# Patient Record
Sex: Female | Born: 1965
Health system: Southern US, Community
[De-identification: ages and names within clinical notes are randomized; demographics above are authoritative.]

## PROBLEM LIST (undated history)

## (undated) DIAGNOSIS — Z90710 Acquired absence of both cervix and uterus: Secondary | ICD-10-CM

## (undated) DIAGNOSIS — E785 Hyperlipidemia, unspecified: Secondary | ICD-10-CM

## (undated) DIAGNOSIS — Z8541 Personal history of malignant neoplasm of cervix uteri: Secondary | ICD-10-CM

## (undated) DIAGNOSIS — N6019 Diffuse cystic mastopathy of unspecified breast: Secondary | ICD-10-CM

## (undated) DIAGNOSIS — F329 Major depressive disorder, single episode, unspecified: Secondary | ICD-10-CM

## (undated) DIAGNOSIS — F259 Schizoaffective disorder, unspecified: Secondary | ICD-10-CM

## (undated) DIAGNOSIS — J302 Other seasonal allergic rhinitis: Secondary | ICD-10-CM

## (undated) DIAGNOSIS — F319 Bipolar disorder, unspecified: Secondary | ICD-10-CM

## (undated) DIAGNOSIS — N289 Disorder of kidney and ureter, unspecified: Secondary | ICD-10-CM

## (undated) DIAGNOSIS — Z08 Encounter for follow-up examination after completed treatment for malignant neoplasm: Secondary | ICD-10-CM

## (undated) DIAGNOSIS — M1990 Unspecified osteoarthritis, unspecified site: Secondary | ICD-10-CM

## (undated) DIAGNOSIS — Z720 Tobacco use: Secondary | ICD-10-CM

## (undated) DIAGNOSIS — F419 Anxiety disorder, unspecified: Secondary | ICD-10-CM

## (undated) DIAGNOSIS — R87629 Unspecified abnormal cytological findings in specimens from vagina: Secondary | ICD-10-CM

## (undated) DIAGNOSIS — Z87442 Personal history of urinary calculi: Secondary | ICD-10-CM

## (undated) DIAGNOSIS — F32A Depression, unspecified: Secondary | ICD-10-CM

## (undated) DIAGNOSIS — D649 Anemia, unspecified: Secondary | ICD-10-CM

## (undated) HISTORY — DX: Acquired absence of both cervix and uterus: Z90.710

## (undated) HISTORY — DX: Unspecified abnormal cytological findings in specimens from vagina: R87.629

## (undated) HISTORY — DX: Bipolar disorder, unspecified: F31.9

## (undated) HISTORY — DX: Major depressive disorder, single episode, unspecified: F32.9

## (undated) HISTORY — DX: Diffuse cystic mastopathy of unspecified breast: N60.19

## (undated) HISTORY — DX: Schizoaffective disorder, unspecified: F25.9

## (undated) HISTORY — PX: SUBLINGUAL CYST EXCISION: SHX5314

## (undated) HISTORY — DX: Hyperlipidemia, unspecified: E78.5

## (undated) HISTORY — DX: Encounter for follow-up examination after completed treatment for malignant neoplasm: Z08

## (undated) HISTORY — DX: Tobacco use: Z72.0

## (undated) HISTORY — DX: Personal history of malignant neoplasm of cervix uteri: Z85.41

## (undated) HISTORY — DX: Unspecified osteoarthritis, unspecified site: M19.90

## (undated) HISTORY — DX: Depression, unspecified: F32.A

## (undated) HISTORY — DX: Anxiety disorder, unspecified: F41.9

---

## 1986-11-14 HISTORY — PX: ABDOMINAL HYSTERECTOMY: SHX81

## 2011-01-17 ENCOUNTER — Other Ambulatory Visit: Payer: Self-pay | Admitting: *Deleted

## 2011-01-17 ENCOUNTER — Other Ambulatory Visit: Payer: Self-pay | Admitting: Internal Medicine

## 2011-01-17 DIAGNOSIS — N644 Mastodynia: Secondary | ICD-10-CM

## 2011-01-25 ENCOUNTER — Ambulatory Visit
Admission: RE | Admit: 2011-01-25 | Discharge: 2011-01-25 | Disposition: A | Discharge status: Home / Self Care | Payer: No Typology Code available for payment source | Source: Ambulatory Visit | Attending: *Deleted | Admitting: *Deleted

## 2011-01-25 DIAGNOSIS — N644 Mastodynia: Secondary | ICD-10-CM

## 2011-01-25 MED ORDER — GADOBENATE DIMEGLUMINE 529 MG/ML IV SOLN
17.0000 mL | Freq: Once | INTRAVENOUS | Status: AC | PRN
  Administered 2011-01-25: 17 mL via INTRAVENOUS

## 2013-11-14 HISTORY — PX: APPENDECTOMY: SHX54

## 2013-12-14 ENCOUNTER — Encounter (HOSPITAL_COMMUNITY): Payer: Self-pay

## 2013-12-14 ENCOUNTER — Ambulatory Visit (HOSPITAL_COMMUNITY)
Admission: RE | Admit: 2013-12-14 | Discharge: 2013-12-14 | Disposition: A | Discharge status: Home / Self Care | Payer: Medicare Other | Source: Ambulatory Visit | Attending: General Surgery | Admitting: General Surgery

## 2013-12-14 ENCOUNTER — Encounter: Payer: Self-pay | Admitting: Interventional Radiology

## 2013-12-14 ENCOUNTER — Other Ambulatory Visit (HOSPITAL_COMMUNITY): Payer: Self-pay | Admitting: General Surgery

## 2013-12-14 ENCOUNTER — Ambulatory Visit (HOSPITAL_COMMUNITY)
Admission: EM | Admit: 2013-12-14 | Discharge: 2013-12-14 | Disposition: A | Discharge status: Home / Self Care | Payer: Medicare Other | Source: Other Acute Inpatient Hospital | Attending: Emergency Medicine | Admitting: Emergency Medicine

## 2013-12-14 DIAGNOSIS — Y836 Removal of other organ (partial) (total) as the cause of abnormal reaction of the patient, or of later complication, without mention of misadventure at the time of the procedure: Secondary | ICD-10-CM | POA: Insufficient documentation

## 2013-12-14 DIAGNOSIS — L0291 Cutaneous abscess, unspecified: Secondary | ICD-10-CM

## 2013-12-14 DIAGNOSIS — T8149XA Infection following a procedure, other surgical site, initial encounter: Principal | ICD-10-CM

## 2013-12-14 DIAGNOSIS — T8140XA Infection following a procedure, unspecified, initial encounter: Secondary | ICD-10-CM | POA: Insufficient documentation

## 2013-12-14 DIAGNOSIS — T8143XA Infection following a procedure, organ and space surgical site, initial encounter: Secondary | ICD-10-CM

## 2013-12-14 DIAGNOSIS — L03319 Cellulitis of trunk, unspecified: Secondary | ICD-10-CM

## 2013-12-14 DIAGNOSIS — L02219 Cutaneous abscess of trunk, unspecified: Secondary | ICD-10-CM | POA: Insufficient documentation

## 2013-12-14 MED ORDER — MIDAZOLAM HCL 2 MG/2ML IJ SOLN
INTRAMUSCULAR | Status: AC | PRN
  Administered 2013-12-14: 2 mg via INTRAVENOUS
  Administered 2013-12-14: 1 mg via INTRAVENOUS

## 2013-12-14 MED ORDER — LIDOCAINE HCL 1 % IJ SOLN
INTRAMUSCULAR | Status: AC
  Filled 2013-12-14: qty 10

## 2013-12-14 MED ORDER — FENTANYL CITRATE 0.05 MG/ML IJ SOLN
INTRAMUSCULAR | Status: AC
  Filled 2013-12-14: qty 4

## 2013-12-14 MED ORDER — FENTANYL CITRATE 0.05 MG/ML IJ SOLN
INTRAMUSCULAR | Status: AC | PRN
  Administered 2013-12-14 (×2): 50 ug via INTRAVENOUS

## 2013-12-14 MED ORDER — MIDAZOLAM HCL 2 MG/2ML IJ SOLN
INTRAMUSCULAR | Status: AC
  Filled 2013-12-14: qty 4

## 2013-12-14 NOTE — Progress Notes (Unsigned)
Patient ID: Regina Carson, female   DOB: Oct 21, 1966, 48 y.o.   MRN: [id number]  CT guided RLQ abscess drain placed 77ml beige purulent aspirate sent for GS, C&S No complication No blood loss. See complete dictation in [facility].

## 2013-12-17 LAB — CULTURE, ROUTINE-ABSCESS: Special Requests: NORMAL

## 2015-12-14 ENCOUNTER — Other Ambulatory Visit: Payer: Self-pay | Admitting: Family Medicine

## 2015-12-14 DIAGNOSIS — F1210 Cannabis abuse, uncomplicated: Secondary | ICD-10-CM | POA: Diagnosis not present

## 2015-12-14 DIAGNOSIS — F329 Major depressive disorder, single episode, unspecified: Secondary | ICD-10-CM | POA: Diagnosis not present

## 2015-12-14 DIAGNOSIS — N6002 Solitary cyst of left breast: Secondary | ICD-10-CM | POA: Diagnosis not present

## 2015-12-14 DIAGNOSIS — L631 Alopecia universalis: Secondary | ICD-10-CM | POA: Diagnosis not present

## 2015-12-14 DIAGNOSIS — Z1231 Encounter for screening mammogram for malignant neoplasm of breast: Secondary | ICD-10-CM

## 2015-12-14 DIAGNOSIS — R300 Dysuria: Secondary | ICD-10-CM | POA: Diagnosis not present

## 2015-12-23 ENCOUNTER — Ambulatory Visit
Admission: RE | Admit: 2015-12-23 | Discharge: 2015-12-23 | Disposition: A | Discharge status: Home / Self Care | Payer: Medicare Other | Source: Ambulatory Visit | Attending: Family Medicine | Admitting: Family Medicine

## 2015-12-23 DIAGNOSIS — Z1231 Encounter for screening mammogram for malignant neoplasm of breast: Secondary | ICD-10-CM | POA: Diagnosis not present

## 2015-12-29 ENCOUNTER — Other Ambulatory Visit: Payer: Self-pay | Admitting: Family Medicine

## 2015-12-29 DIAGNOSIS — R928 Other abnormal and inconclusive findings on diagnostic imaging of breast: Secondary | ICD-10-CM

## 2016-01-01 ENCOUNTER — Ambulatory Visit
Admission: RE | Admit: 2016-01-01 | Discharge: 2016-01-01 | Disposition: A | Discharge status: Home / Self Care | Payer: Medicare Other | Source: Ambulatory Visit | Attending: Family Medicine | Admitting: Family Medicine

## 2016-01-01 DIAGNOSIS — R928 Other abnormal and inconclusive findings on diagnostic imaging of breast: Secondary | ICD-10-CM | POA: Diagnosis not present

## 2016-01-01 DIAGNOSIS — N6001 Solitary cyst of right breast: Secondary | ICD-10-CM | POA: Diagnosis not present

## 2016-01-13 DIAGNOSIS — F329 Major depressive disorder, single episode, unspecified: Secondary | ICD-10-CM | POA: Diagnosis not present

## 2016-01-13 DIAGNOSIS — Z1389 Encounter for screening for other disorder: Secondary | ICD-10-CM | POA: Diagnosis not present

## 2016-01-13 DIAGNOSIS — F1210 Cannabis abuse, uncomplicated: Secondary | ICD-10-CM | POA: Diagnosis not present

## 2016-01-13 DIAGNOSIS — L631 Alopecia universalis: Secondary | ICD-10-CM | POA: Diagnosis not present

## 2016-01-13 DIAGNOSIS — N6002 Solitary cyst of left breast: Secondary | ICD-10-CM | POA: Diagnosis not present

## 2016-01-13 DIAGNOSIS — R300 Dysuria: Secondary | ICD-10-CM | POA: Diagnosis not present

## 2016-06-07 DIAGNOSIS — M791 Myalgia: Secondary | ICD-10-CM | POA: Diagnosis not present

## 2016-06-07 DIAGNOSIS — M5416 Radiculopathy, lumbar region: Secondary | ICD-10-CM | POA: Diagnosis not present

## 2016-06-07 DIAGNOSIS — F329 Major depressive disorder, single episode, unspecified: Secondary | ICD-10-CM | POA: Diagnosis not present

## 2016-06-07 DIAGNOSIS — L631 Alopecia universalis: Secondary | ICD-10-CM | POA: Diagnosis not present

## 2016-06-07 DIAGNOSIS — B370 Candidal stomatitis: Secondary | ICD-10-CM | POA: Diagnosis not present

## 2016-08-03 DIAGNOSIS — J209 Acute bronchitis, unspecified: Secondary | ICD-10-CM | POA: Diagnosis not present

## 2016-09-02 DIAGNOSIS — N6002 Solitary cyst of left breast: Secondary | ICD-10-CM | POA: Diagnosis not present

## 2016-09-02 DIAGNOSIS — B370 Candidal stomatitis: Secondary | ICD-10-CM | POA: Diagnosis not present

## 2016-09-02 DIAGNOSIS — N951 Menopausal and female climacteric states: Secondary | ICD-10-CM | POA: Diagnosis not present

## 2016-09-06 DIAGNOSIS — M5416 Radiculopathy, lumbar region: Secondary | ICD-10-CM | POA: Diagnosis not present

## 2016-09-06 DIAGNOSIS — M545 Low back pain: Secondary | ICD-10-CM | POA: Diagnosis not present

## 2016-09-06 DIAGNOSIS — M62838 Other muscle spasm: Secondary | ICD-10-CM | POA: Diagnosis not present

## 2016-09-12 ENCOUNTER — Telehealth (HOSPITAL_COMMUNITY): Payer: Self-pay | Admitting: *Deleted

## 2016-09-12 NOTE — Telephone Encounter (Signed)
SPOKE WITH PATIENT, SHE SAID SHE WILL CALL BACK.

## 2016-09-14 DIAGNOSIS — M545 Low back pain: Secondary | ICD-10-CM | POA: Diagnosis not present

## 2016-09-14 DIAGNOSIS — M5416 Radiculopathy, lumbar region: Secondary | ICD-10-CM | POA: Diagnosis not present

## 2016-09-16 ENCOUNTER — Telehealth (HOSPITAL_COMMUNITY): Payer: Self-pay | Admitting: *Deleted

## 2016-09-16 DIAGNOSIS — M545 Low back pain: Secondary | ICD-10-CM | POA: Diagnosis not present

## 2016-09-16 DIAGNOSIS — M5416 Radiculopathy, lumbar region: Secondary | ICD-10-CM | POA: Diagnosis not present

## 2016-09-16 NOTE — Telephone Encounter (Signed)
left voice message, provider out of office.  please call to reschedule appointment.

## 2016-09-20 NOTE — Progress Notes (Deleted)
Psychiatric Initial Adult Assessment   Patient Identification: Regina Carson MRN:  [id number] Date of Evaluation:  09/20/2016 Referral Source: *** Chief Complaint:   Visit Diagnosis: No diagnosis found.  History of Present Illness:  ***  Associated Signs/Symptoms: Depression Symptoms:  {DEPRESSION SYMPTOMS:20000} (Hypo) Manic Symptoms:  {BHH MANIC SYMPTOMS:22872} Anxiety Symptoms:  {BHH ANXIETY SYMPTOMS:22873} Psychotic Symptoms:  {BHH PSYCHOTIC SYMPTOMS:22874} PTSD Symptoms: {BHH PTSD SYMPTOMS:22875}  Past Psychiatric History: ***  Previous Psychotropic Medications: {YES/NO:21197}  Substance Abuse History in the last 12 months:  {yes no:314532}  Consequences of Substance Abuse: {BHH CONSEQUENCES OF SUBSTANCE ABUSE:22880}  Past Medical History: No past medical history on file. No past surgical history on file.  Family Psychiatric History: ***  Family History: No family history on file.  Social History:   Social History   Social History  . Marital status: Married    Spouse name: N/A  . Number of children: N/A  . Years of education: N/A   Social History Main Topics  . Smoking status: Light Tobacco Smoker  . Smokeless tobacco: Not on file  . Alcohol use Yes     Comment: social   . Drug use:     Frequency: 1.0 time per week    Types: Marijuana  . Sexual activity: Not on file   Other Topics Concern  . Not on file   Social History Narrative  . No narrative on file    Additional Social History: ***  Allergies:  No Known Allergies  Metabolic Disorder Labs: No results found for: HGBA1C, MPG No results found for: PROLACTIN No results found for: CHOL, TRIG, HDL, CHOLHDL, VLDL, LDLCALC   Current Medications: No current outpatient prescriptions on file.   No current facility-administered medications for this visit.     Neurologic: Headache: {BHH YES OR NO:22294} Seizure: {BHH YES OR NO:22294} Paresthesias:{BHH YES OR  XR:537143  Musculoskeletal: Strength & Muscle Tone: {desc; muscle tone:32375} Gait & Station: {PE GAIT ED QX:8161427 Patient leans: {Patient Leans:21022755}  Psychiatric Specialty Exam: ROS  There were no vitals taken for this visit.There is no height or weight on file to calculate BMI.  General Appearance: {Appearance:22683}  Eye Contact:  {BHH EYE CONTACT:22684}  Speech:  {Speech:22685}  Volume:  {Volume (PAA):22686}  Mood:  {BHH MOOD:22306}  Affect:  {Affect (PAA):22687}  Thought Process:  {Thought Process (PAA):22688}  Orientation:  {BHH ORIENTATION (PAA):22689}  Thought Content:  {Thought Content:22690}  Suicidal Thoughts:  {ST/HT (PAA):22692}  Homicidal Thoughts:  {ST/HT (PAA):22692}  Memory:  {BHH MEMORY:22881}  Judgement:  {Judgement (PAA):22694}  Insight:  {Insight (PAA):22695}  Psychomotor Activity:  {Psychomotor (PAA):22696}  Concentration:  {Concentration:21399}  Recall:  {BHH GOOD/FAIR/POOR:22877}  Fund of Knowledge:{BHH GOOD/FAIR/POOR:22877}  Language: {BHH GOOD/FAIR/POOR:22877}  Akathisia:  {BHH YES OR NO:22294}  Handed:  {Handed:22697}  AIMS (if indicated):  ***  Assets:  {Assets (PAA):22698}  ADL's:  {BHH TW:9249394  Cognition: {chl bhh cognition:304700322}  Sleep:  ***   Assessment  Plan  The patient demonstrates the following risk factors for suicide: Chronic risk factors for suicide include: {Chronic Risk Factors for HD:3327074. Acute risk factors for suicide include: {Acute Risk Factors for NL:6244280. Protective factors for this patient include: {Protective Factors for Suicide FR:7288263. Considering these factors, the overall suicide risk at this point appears to be {Desc; low/moderate/high:110033}. Patient {ACTION; IS/IS GI:087931 appropriate for outpatient follow up.   Treatment Plan Summary: {CHL AMB [facility] MD [place] YF:[id number]   Norman Clay, MD 11/7/20179:48 AM

## 2016-09-21 DIAGNOSIS — M5416 Radiculopathy, lumbar region: Secondary | ICD-10-CM | POA: Diagnosis not present

## 2016-09-21 DIAGNOSIS — M545 Low back pain: Secondary | ICD-10-CM | POA: Diagnosis not present

## 2016-09-22 ENCOUNTER — Ambulatory Visit (HOSPITAL_COMMUNITY): Payer: Self-pay | Admitting: Psychiatry

## 2016-12-08 DIAGNOSIS — K5900 Constipation, unspecified: Secondary | ICD-10-CM | POA: Diagnosis not present

## 2016-12-08 DIAGNOSIS — K08409 Partial loss of teeth, unspecified cause, unspecified class: Secondary | ICD-10-CM | POA: Diagnosis not present

## 2016-12-08 DIAGNOSIS — K047 Periapical abscess without sinus: Secondary | ICD-10-CM | POA: Diagnosis not present

## 2016-12-12 ENCOUNTER — Encounter: Payer: Self-pay | Admitting: Family Medicine

## 2016-12-12 ENCOUNTER — Telehealth (HOSPITAL_COMMUNITY): Payer: Self-pay | Admitting: *Deleted

## 2016-12-12 ENCOUNTER — Ambulatory Visit (INDEPENDENT_AMBULATORY_CARE_PROVIDER_SITE_OTHER): Payer: Medicare Other | Admitting: Family Medicine

## 2016-12-12 VITALS — BP 114/62 | HR 76 | Temp 98.0°F | Resp 20 | Ht 62.0 in | Wt 187.0 lb

## 2016-12-12 DIAGNOSIS — F250 Schizoaffective disorder, bipolar type: Secondary | ICD-10-CM

## 2016-12-12 DIAGNOSIS — F419 Anxiety disorder, unspecified: Secondary | ICD-10-CM | POA: Diagnosis not present

## 2016-12-12 DIAGNOSIS — K029 Dental caries, unspecified: Secondary | ICD-10-CM

## 2016-12-12 DIAGNOSIS — F319 Bipolar disorder, unspecified: Secondary | ICD-10-CM | POA: Diagnosis not present

## 2016-12-12 DIAGNOSIS — K5909 Other constipation: Secondary | ICD-10-CM

## 2016-12-12 DIAGNOSIS — F259 Schizoaffective disorder, unspecified: Secondary | ICD-10-CM | POA: Insufficient documentation

## 2016-12-12 DIAGNOSIS — H9312 Tinnitus, left ear: Secondary | ICD-10-CM

## 2016-12-12 DIAGNOSIS — Z720 Tobacco use: Secondary | ICD-10-CM

## 2016-12-12 DIAGNOSIS — K635 Polyp of colon: Secondary | ICD-10-CM

## 2016-12-12 DIAGNOSIS — Z7689 Persons encountering health services in other specified circumstances: Secondary | ICD-10-CM

## 2016-12-12 HISTORY — DX: Tobacco use: Z72.0

## 2016-12-12 NOTE — Telephone Encounter (Signed)
Office received new ref from [place]. office to sch new pt appt. Called number that's provided on ref and message stated call can not be completed as dial. Staff tried number 3 times and still same message.

## 2016-12-12 NOTE — Progress Notes (Signed)
Chief Complaint  Patient presents with  . Establish Care   New to establish No old records. Is mentally ill, off of all medications.  Tells me she has anxiety, depression, bipolar depression and schizoaffective disorder and "all that".  She wants Rx for klonopin or xanax, and adderall.  Doesn't like Zoloft.  I am referring her to mental health for care as this is out of my comfort level. She has abnormal mammogram with multiple cysts.  Is due for a mammo in Feb, so this is ordered. She had a colonoscopy in 2010 for bleeding, and polyps were found.  Was supposed to get another in 28 y, so is due. Hysterectomy.  No recent pap States shots are up to date Uncertain lab testing Complains of severe tailbone pain from a fall over a year ago.    I have discussed the multiple health risks associated with cigarette smoking including, but not limited to, cardiovascular disease, lung disease and cancer.  I have strongly recommended that smoking be stopped.  I have reviewed the various methods of quitting including cold [place], classes, nicotine replacements and prescription medications.  I have offered assistance in this difficult process.  The patient is not interested in assistance at this time.   Patient Active Problem List   Diagnosis Date Noted  . Bipolar 1 disorder ([place]) 12/12/2016  . Schizoaffective disorder ([place]) 12/12/2016  . Anxiety disorder 12/12/2016  . Constipation, chronic 12/12/2016  . Colon polyps 12/12/2016  . Tinnitus, left 12/12/2016  . Dental caries 12/12/2016  . Tobacco abuse 12/12/2016    Outpatient Encounter Prescriptions as of 12/12/2016  Medication Sig  . calcium-vitamin D (OSCAL WITH D) 500-200 MG-UNIT tablet Take 1 tablet by mouth.  . potassium gluconate 595 (99 K) MG TABS tablet Take 595 mg by mouth.   No facility-administered encounter medications on file as of 12/12/2016.     Past Medical History:  Diagnosis Date  . Anxiety   . Arthritis   . Bipolar 1  disorder ([place])   . Depression   . Fibrocystic breast disease   . Hyperlipidemia   . Schizoaffective disorder ([place])   . Tobacco abuse 12/12/2016    Past Surgical History:  Procedure Laterality Date  . ABDOMINAL HYSTERECTOMY  1988   class 4 pap smear  . APPENDECTOMY  2015    Social History   Social History  . Marital status: Single    Spouse name: N/A  . Number of children: 1  . Years of education: 38   Occupational History  . disabled     mental   Social History Main Topics  . Smoking status: Light Tobacco Smoker  . Smokeless tobacco: Never Used  . Alcohol use Yes     Comment: social   . Drug use: Yes    Frequency: 1.0 time per week    Types: Marijuana  . Sexual activity: Not Currently    Birth control/ protection: Surgical   Other Topics Concern  . Not on file   Social History Narrative   Disabled mental illness   Lives alone    Family History  Problem Relation Age of Onset  . Hyperlipidemia Mother   . Hypertension Mother   . Liver cancer Father   . Alcohol abuse Father   . Mental illness Sister   . Mental illness Brother     Review of Systems  Constitutional: Negative for chills, fever and weight loss.  HENT: Negative for congestion and hearing loss.  Poor dentition  Eyes: Negative for blurred vision and pain.  Respiratory: Negative for cough and shortness of breath.   Cardiovascular: Negative for chest pain and leg swelling.  Gastrointestinal: Positive for constipation. Negative for abdominal pain, diarrhea and heartburn.  Genitourinary: Negative for dysuria and frequency.  Musculoskeletal: Positive for back pain. Negative for falls, joint pain and myalgias.       Tailbone pain from fall a year ago  Neurological: Negative for dizziness, seizures and headaches.  Psychiatric/Behavioral: Positive for depression. The patient is nervous/anxious. The patient does not have insomnia.     BP 114/62 (BP Location: Right Arm, Patient Position:  Sitting, Cuff Size: Normal)   Pulse 76   Temp 98 F (36.7 C) (Oral)   Resp 20   Ht 5\' 2"  (1.575 m)   Wt 187 lb (84.8 kg)   SpO2 98%   BMI 34.20 kg/m   Physical Exam  Constitutional: She is oriented to person, place, and time. She appears well-developed and well-nourished.  HENT:  Head: Normocephalic and atraumatic.  Mouth/Throat: Oropharynx is clear and moist.  Fractured teeth and gum disease  Eyes: Conjunctivae are normal. Pupils are equal, round, and reactive to light.  Neck: Normal range of motion. Neck supple. No thyromegaly present.  Cardiovascular: Normal rate, regular rhythm and normal heart sounds.   Pulmonary/Chest: Effort normal and breath sounds normal. No respiratory distress.  Musculoskeletal: She exhibits no edema.  Lymphadenopathy:    She has no cervical adenopathy.  Neurological: She is alert and oriented to person, place, and time.  Gait normal, paces around room  Skin: Skin is warm and dry.  Psychiatric: Her mood appears anxious. Her affect is inappropriate. Her speech is rapid and/or pressured. She is agitated. Thought content is paranoid. She expresses impulsivity.  Pants, paces, loses train of thought.  Claims old doctors office didn't listen or treat her well  Nursing note and vitals reviewed.   1. Bipolar 1 disorder (HCC)  2. Schizoaffective disorder, bipolar type ([place])  - Comprehensive metabolic panel - CBC - Lipid panel - Urinalysis, Routine w reflex microscopic - VITAMIN D 25 Hydroxy (Vit-D Deficiency, Fractures) - MM Digital Screening; Future - Ambulatory referral to Gastroenterology - Ambulatory referral to Psychiatry - Hemoglobin A1c - Hepatitis C antibody  3. Anxiety disorder, unspecified type  4. Constipation, chronic  5. Polyp of colon, unspecified part of colon, unspecified type - Ambulatory referral to Gastroenterology  6. Tinnitus, left  7. Dental caries  8. Encounter to establish care with new doctor  9. Tobacco  abuse    Patient Instructions  Need old records PCP Need records [place]  [place] to the mental health office for an appointment  I have referred for colonoscopy and a mammogram  Come back for a physical Due for blood tests     Raylene Everts, MD

## 2016-12-12 NOTE — Patient Instructions (Signed)
Need old records PCP Need records [place]  [place] to the mental health office for an appointment  I have referred for colonoscopy and a mammogram  Come back for a physical Due for blood tests

## 2016-12-19 ENCOUNTER — Encounter: Payer: Self-pay | Admitting: Internal Medicine

## 2016-12-19 ENCOUNTER — Ambulatory Visit (INDEPENDENT_AMBULATORY_CARE_PROVIDER_SITE_OTHER): Payer: Medicare Other | Admitting: Family Medicine

## 2016-12-19 ENCOUNTER — Encounter: Payer: Self-pay | Admitting: Family Medicine

## 2016-12-19 VITALS — BP 114/78 | HR 76 | Temp 98.5°F | Resp 16 | Ht 62.0 in | Wt 183.0 lb

## 2016-12-19 DIAGNOSIS — B352 Tinea manuum: Secondary | ICD-10-CM

## 2016-12-19 DIAGNOSIS — E559 Vitamin D deficiency, unspecified: Secondary | ICD-10-CM | POA: Diagnosis not present

## 2016-12-19 DIAGNOSIS — K029 Dental caries, unspecified: Secondary | ICD-10-CM | POA: Diagnosis not present

## 2016-12-19 DIAGNOSIS — H9312 Tinnitus, left ear: Secondary | ICD-10-CM | POA: Diagnosis not present

## 2016-12-19 DIAGNOSIS — Z720 Tobacco use: Secondary | ICD-10-CM | POA: Diagnosis not present

## 2016-12-19 DIAGNOSIS — B353 Tinea pedis: Secondary | ICD-10-CM

## 2016-12-19 DIAGNOSIS — Z0000 Encounter for general adult medical examination without abnormal findings: Secondary | ICD-10-CM | POA: Diagnosis not present

## 2016-12-19 DIAGNOSIS — K439 Ventral hernia without obstruction or gangrene: Secondary | ICD-10-CM

## 2016-12-19 MED ORDER — AMOXICILLIN-POT CLAVULANATE 875-125 MG PO TABS: 1.0000 | ORAL_TABLET | Freq: Two times a day (BID) | ORAL | 0 refills | Status: DC

## 2016-12-19 NOTE — Patient Instructions (Signed)
Blood tests today I will send you a letter with your test results.  If there is anything of concern, we will call right away.   Mammogram ordered Colonoscopy ordered Referral to dermatology placed Keep appointment with dentist and psychiatrist  See me in 3 months Call sooner for problems

## 2016-12-19 NOTE — Progress Notes (Signed)
Chief Complaint  Patient presents with  . Annual Exam   Patient is here for a physical examination. She was here couple weeks ago and a colonoscopy and mammogram were ordered. Lab work was ordered, she has not yet had this drawn. She was referred to psychiatry and her appointment is scheduled for February 13th. She saw a dentist and was given an antibiotic, with an appointment at some point in the future. She is still having dental pain. I will refill her antibiotic in case it is needed. She is having trouble controlling her anxiety and impulses off of her medication. She states she is staying in her apartment in order not to "punched somebody". I advised her she might try taking a walk every day. I offered her the crisis number in case it is needed. Patient states she is trying to cut back on the number of cigarettes she smoking.   Patient Active Problem List   Diagnosis Date Noted  . Tinea manuum 12/19/2016  . Tinea pedis of both feet 12/19/2016  . Ventral hernia 12/19/2016  . Bipolar 1 disorder ([place]) 12/12/2016  . Schizoaffective disorder ([place]) 12/12/2016  . Anxiety disorder 12/12/2016  . Constipation, chronic 12/12/2016  . Colon polyps 12/12/2016  . Tinnitus, left 12/12/2016  . Dental caries 12/12/2016  . Tobacco abuse 12/12/2016    Outpatient Encounter Prescriptions as of 12/19/2016  Medication Sig  . calcium-vitamin D (OSCAL WITH D) 500-200 MG-UNIT tablet Take 1 tablet by mouth.  . potassium gluconate 595 (99 K) MG TABS tablet Take 595 mg by mouth.  [place] Kitchen amoxicillin-clavulanate (AUGMENTIN) 875-125 MG tablet Take 1 tablet by mouth 2 (two) times daily.   No facility-administered encounter medications on file as of 12/19/2016.     No Known Allergies  Review of Systems  Constitutional: Positive for activity change. Negative for appetite change.  HENT: Positive for dental problem. Negative for postnasal drip and rhinorrhea.   Eyes: Negative for photophobia and visual  disturbance.  Respiratory: Negative for cough and shortness of breath.   Cardiovascular: Negative for chest pain and palpitations.  Gastrointestinal: Positive for constipation. Negative for blood in stool and diarrhea.  Genitourinary: Negative for difficulty urinating and vaginal bleeding.  Musculoskeletal: Positive for back pain.       Back pain and knee pain, uses a cane  Skin: Positive for rash.       Rash on right hand and both feet  Neurological: Negative for dizziness and headaches.  Psychiatric/Behavioral: Positive for agitation, behavioral problems and dysphoric mood. Negative for sleep disturbance. The patient is nervous/anxious.     BP 114/78 (BP Location: Left Arm, Patient Position: Sitting, Cuff Size: Normal)   Pulse 76   Temp 98.5 F (36.9 C) (Oral)   Resp 16   Ht 5\' 2"  (1.575 m)   Wt 183 lb (83 kg)   SpO2 97%   BMI 33.47 kg/m   Physical Exam BP 114/78 (BP Location: Left Arm, Patient Position: Sitting, Cuff Size: Normal)   Pulse 76   Temp 98.5 F (36.9 C) (Oral)   Resp 16   Ht 5\' 2"  (1.575 m)   Wt 183 lb (83 kg)   SpO2 97%   BMI 33.47 kg/m   General Appearance:    Alert, cooperative,Mild distress, appears stated age. She is talkative and mildly hyperkinetic   Head:    Normocephalic, without obvious abnormality, atraumatic  Eyes:    PERRL, conjunctiva/corneas clear, EOM's intact, fundi    benign, both eyes  Ears:    Normal TM's and external ear canals, both ears  Nose:   Nares normal, septum midline, mucosa normal, no drainage    or sinus tenderness  Throat:   Lips, mucosa, and tongue normal; teethWith periodontal disease, fractures, and carries and gums normal  Neck:   Supple, symmetrical, trachea midline, no adenopathy;    thyroid:  no enlargement/tenderness/nodules; no carotid   bruit   Back:     Symmetric, no curvature, ROM normal, no CVA tenderness  Lungs:     Clear to auscultation bilaterally, respirations unlabored  Chest Wall:    No tenderness or  deformity   Heart:    Regular rate and rhythm, S1 and S2 normal, no murmur, rub   or gallop  Breast Exam:    Soft, intermittent areas of tenderness. Multiple mobile nodules. No suspicious masses, or nipple abnormality  Abdomen:     Soft, non-tender, bowel sounds active all four quadrants,    no masses, no organomegaly. Well-healed midline scar. Ventral hernia above the umbilicus.   Extremities:   Extremities normal, atraumatic, no cyanosis or edema  Pulses:   2+ and symmetric all extremities  Skin:   Skin color, texture, turgor normal, Right hand and both feet with a tinea rash, thickening of the palms and soles, scale, no pustules or lesions  Lymph nodes:   CervicalNode tender left jaw (left dental abscess), supraclavicular, and axillary nodes normal  Neurologic:   CNII-XII intact, normal strength, sensation and reflexes    throughout    ASSESSMENT/PLAN:  1. Annual physical exam No unexpected findings. Ventral hernia benign  2. Tinea manuum Tinea magnum and pedis, chronic - Ambulatory referral to Dermatology  3. Dental caries Dental infection, and test pending  4. Tobacco abuse Trying to reduce  5. Tinea pedis of both feet   6. Ventral hernia without obstruction or gangrene    Patient Instructions  Blood tests today I will send you a letter with your test results.  If there is anything of concern, we will call right away.   Mammogram ordered Colonoscopy ordered Referral to dermatology placed Keep appointment with dentist and psychiatrist  See me in 3 months Call sooner for problems    Raylene Everts, MD

## 2016-12-20 ENCOUNTER — Encounter: Payer: Self-pay | Admitting: Family Medicine

## 2016-12-20 LAB — COMPREHENSIVE METABOLIC PANEL
ALT: 15 U/L (ref 6–29)
AST: 17 U/L (ref 10–35)
Albumin: 3.9 g/dL (ref 3.6–5.1)
Alkaline Phosphatase: 47 U/L (ref 33–130)
BUN: 7 mg/dL (ref 7–25)
CHLORIDE: 104 mmol/L (ref 98–110)
CO2: 29 mmol/L (ref 20–31)
Calcium: 9.3 mg/dL (ref 8.6–10.4)
Creat: 0.9 mg/dL (ref 0.50–1.05)
Glucose, Bld: 81 mg/dL (ref 65–99)
Potassium: 4.2 mmol/L (ref 3.5–5.3)
Sodium: 139 mmol/L (ref 135–146)
TOTAL PROTEIN: 6.3 g/dL (ref 6.1–8.1)
Total Bilirubin: 0.7 mg/dL (ref 0.2–1.2)

## 2016-12-20 LAB — CBC
HEMATOCRIT: 43.3 % (ref 35.0–45.0)
Hemoglobin: 14.2 g/dL (ref 11.7–15.5)
MCH: 31.7 pg (ref 27.0–33.0)
MCHC: 32.8 g/dL (ref 32.0–36.0)
MCV: 96.7 fL (ref 80.0–100.0)
MPV: 9.4 fL (ref 7.5–12.5)
Platelets: 330 10*3/uL (ref 140–400)
RBC: 4.48 MIL/uL (ref 3.80–5.10)
RDW: 13.3 % (ref 11.0–15.0)
WBC: 9.5 10*3/uL (ref 3.8–10.8)

## 2016-12-20 LAB — LIPID PANEL
Cholesterol: 152 mg/dL (ref <200)
HDL: 57 mg/dL (ref >50)
LDL Cholesterol: 84 mg/dL (ref <100)
TRIGLYCERIDES: 57 mg/dL (ref <150)
Total CHOL/HDL Ratio: 2.7 Ratio (ref <5.0)
VLDL: 11 mg/dL (ref <30)

## 2016-12-20 LAB — URINALYSIS, ROUTINE W REFLEX MICROSCOPIC
Bilirubin Urine: NEGATIVE
GLUCOSE, UA: NEGATIVE
HGB URINE DIPSTICK: NEGATIVE
Ketones, ur: NEGATIVE
Leukocytes, UA: NEGATIVE
Nitrite: NEGATIVE
PROTEIN: NEGATIVE
Specific Gravity, Urine: 1.017 (ref 1.001–1.035)
pH: 7 (ref 5.0–8.0)

## 2016-12-20 LAB — HEPATITIS C ANTIBODY: HCV Ab: NEGATIVE

## 2016-12-20 LAB — HEMOGLOBIN A1C
Hgb A1c MFr Bld: 4.6 % (ref <5.7)
Mean Plasma Glucose: 85 mg/dL

## 2016-12-20 LAB — VITAMIN D 25 HYDROXY (VIT D DEFICIENCY, FRACTURES): Vit D, 25-Hydroxy: 14 ng/mL — ABNORMAL LOW (ref 30–100)

## 2016-12-23 NOTE — Progress Notes (Deleted)
Psychiatric Initial Adult Assessment   Patient Identification: Regina Carson MRN:  [id number] Date of Evaluation:  12/23/2016 Referral Source: Dr. [facility] Chief Complaint:   Visit Diagnosis: No diagnosis found.  History of Present Illness:   Regina Carson is  51 year old female with history of schizoaffective disorder per chart, who presented to establish care.  Associated Signs/Symptoms: Depression Symptoms:  {DEPRESSION SYMPTOMS:20000} (Hypo) Manic Symptoms:  {BHH MANIC SYMPTOMS:22872} Anxiety Symptoms:  {BHH ANXIETY SYMPTOMS:22873} Psychotic Symptoms:  {BHH PSYCHOTIC SYMPTOMS:22874} PTSD Symptoms: {BHH PTSD SYMPTOMS:22875}  Past Psychiatric History: ***  Previous Psychotropic Medications: {YES/NO:21197}  Substance Abuse History in the last 12 months:  {yes no:314532}  Consequences of Substance Abuse: {BHH CONSEQUENCES OF SUBSTANCE ABUSE:22880}  Past Medical History:  Past Medical History:  Diagnosis Date  . Anxiety   . Arthritis   . Bipolar 1 disorder ([place])   . Depression   . Fibrocystic breast disease   . Hyperlipidemia   . Schizoaffective disorder ([place])   . Tobacco abuse 12/12/2016    Past Surgical History:  Procedure Laterality Date  . ABDOMINAL HYSTERECTOMY  1988   class 4 pap smear  . APPENDECTOMY  2015    Family Psychiatric History: ***  Family History:  Family History  Problem Relation Age of Onset  . Hyperlipidemia Mother   . Hypertension Mother   . Liver cancer Father   . Alcohol abuse Father   . Mental illness Sister   . Mental illness Brother     Social History:   Social History   Social History  . Marital status: Single    Spouse name: N/A  . Number of children: 1  . Years of education: 39   Occupational History  . disabled     mental   Social History Main Topics  . Smoking status: Light Tobacco Smoker  . Smokeless tobacco: Never Used  . Alcohol use Yes     Comment: social   . Drug  use: Yes    Frequency: 1.0 time per week    Types: Marijuana  . Sexual activity: Not Currently    Birth control/ protection: Surgical   Other Topics Concern  . Not on file   Social History Narrative   Disabled mental illness   Lives alone    Additional Social History: ***  Allergies:  No Known Allergies  Metabolic Disorder Labs: Lab Results  Component Value Date   HGBA1C 4.6 12/19/2016   MPG 85 12/19/2016   No results found for: PROLACTIN Lab Results  Component Value Date   CHOL 152 12/19/2016   TRIG 57 12/19/2016   HDL 57 12/19/2016   CHOLHDL 2.7 12/19/2016   VLDL 11 12/19/2016   LDLCALC 84 12/19/2016     Current Medications: Current Outpatient Prescriptions  Medication Sig Dispense Refill  . amoxicillin-clavulanate (AUGMENTIN) 875-125 MG tablet Take 1 tablet by mouth 2 (two) times daily. 20 tablet 0  . calcium-vitamin D (OSCAL WITH D) 500-200 MG-UNIT tablet Take 1 tablet by mouth.    . potassium gluconate 595 (99 K) MG TABS tablet Take 595 mg by mouth.     No current facility-administered medications for this visit.     Neurologic: Headache: No Seizure: No Paresthesias:No  Musculoskeletal: Strength & Muscle Tone: within normal limits Gait & Station: normal Patient leans: N/A  Psychiatric Specialty Exam: ROS  There were no vitals taken for this visit.There is no height or weight on file to calculate BMI.  General Appearance: Fairly  Groomed  Eye Contact:  Good  Speech:  Clear and Coherent  Volume:  Normal  Mood:  {BHH MOOD:22306}  Affect:  {Affect (PAA):22687}  Thought Process:  Coherent and Goal Directed  Orientation:  Full (Time, Place, and Person)  Thought Content:  Logical  Suicidal Thoughts:  {ST/HT (PAA):22692}  Homicidal Thoughts:  {ST/HT (PAA):22692}  Memory:  Immediate;   Good Recent;   Good Remote;   Good  Judgement:  {Judgement (PAA):22694}  Insight:  {Insight (PAA):22695}  Psychomotor Activity:  Normal  Concentration:   Concentration: Good and Attention Span: Good  Recall:  Good  Fund of Knowledge:Good  Language: Good  Akathisia:  No  Handed:  Right  AIMS (if indicated):  N/A  Assets:  Communication Skills Desire for Improvement  ADL's:  Intact  Cognition: WNL  Sleep:  ***   Assessment  Plan  The patient demonstrates the following risk factors for suicide: Chronic risk factors for suicide include: {Chronic Risk Factors for HD:3327074. Acute risk factors for suicide include: {Acute Risk Factors for NL:6244280. Protective factors for this patient include: {Protective Factors for Suicide FR:7288263. Considering these factors, the overall suicide risk at this point appears to be {Desc; low/moderate/high:110033}. Patient {ACTION; IS/IS GI:087931 appropriate for outpatient follow up.   Treatment Plan Summary: {CHL AMB [facility] MD [place] YF:[id number]   Norman Clay, MD 2/9/20188:44 AM

## 2016-12-27 ENCOUNTER — Ambulatory Visit (HOSPITAL_COMMUNITY): Payer: Self-pay | Admitting: Psychiatry

## 2016-12-27 ENCOUNTER — Other Ambulatory Visit: Payer: Self-pay

## 2016-12-27 MED ORDER — VITAMIN D (ERGOCALCIFEROL) 1.25 MG (50000 UNIT) PO CAPS: 50000.0000 [IU] | ORAL_CAPSULE | ORAL | 0 refills | Status: DC

## 2017-01-02 ENCOUNTER — Telehealth: Payer: Self-pay

## 2017-01-02 DIAGNOSIS — R928 Other abnormal and inconclusive findings on diagnostic imaging of breast: Secondary | ICD-10-CM

## 2017-01-02 DIAGNOSIS — N6009 Solitary cyst of unspecified breast: Secondary | ICD-10-CM

## 2017-01-03 ENCOUNTER — Encounter: Payer: Self-pay | Admitting: Family Medicine

## 2017-01-03 DIAGNOSIS — F1290 Cannabis use, unspecified, uncomplicated: Secondary | ICD-10-CM | POA: Insufficient documentation

## 2017-01-03 DIAGNOSIS — Z765 Malingerer [conscious simulation]: Secondary | ICD-10-CM | POA: Insufficient documentation

## 2017-01-04 ENCOUNTER — Ambulatory Visit: Payer: Medicare Other | Admitting: Gastroenterology

## 2017-01-04 NOTE — Telephone Encounter (Signed)
Orders pended

## 2017-01-10 ENCOUNTER — Other Ambulatory Visit: Payer: Self-pay | Admitting: Family Medicine

## 2017-01-10 DIAGNOSIS — N6009 Solitary cyst of unspecified breast: Secondary | ICD-10-CM

## 2017-01-10 DIAGNOSIS — N6001 Solitary cyst of right breast: Secondary | ICD-10-CM

## 2017-01-10 DIAGNOSIS — N6002 Solitary cyst of left breast: Secondary | ICD-10-CM

## 2017-01-10 NOTE — Addendum Note (Signed)
Addended by: Denman George B on: 01/10/2017 03:12 PM   Modules accepted: Orders
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
## 2017-01-12 ENCOUNTER — Ambulatory Visit (HOSPITAL_COMMUNITY): Payer: Self-pay | Admitting: Psychiatry

## 2017-01-12 ENCOUNTER — Encounter (INDEPENDENT_AMBULATORY_CARE_PROVIDER_SITE_OTHER): Payer: Self-pay

## 2017-01-12 ENCOUNTER — Ambulatory Visit (INDEPENDENT_AMBULATORY_CARE_PROVIDER_SITE_OTHER): Payer: Medicare Other | Admitting: Psychiatry

## 2017-01-12 ENCOUNTER — Encounter (HOSPITAL_COMMUNITY): Payer: Self-pay | Admitting: Psychiatry

## 2017-01-12 VITALS — BP 118/88 | HR 87 | Ht 62.0 in | Wt 193.6 lb

## 2017-01-12 DIAGNOSIS — F1290 Cannabis use, unspecified, uncomplicated: Secondary | ICD-10-CM

## 2017-01-12 DIAGNOSIS — F3181 Bipolar II disorder: Secondary | ICD-10-CM | POA: Diagnosis not present

## 2017-01-12 DIAGNOSIS — Z811 Family history of alcohol abuse and dependence: Secondary | ICD-10-CM

## 2017-01-12 DIAGNOSIS — Z818 Family history of other mental and behavioral disorders: Secondary | ICD-10-CM | POA: Diagnosis not present

## 2017-01-12 DIAGNOSIS — F17210 Nicotine dependence, cigarettes, uncomplicated: Secondary | ICD-10-CM | POA: Diagnosis not present

## 2017-01-12 DIAGNOSIS — F1099 Alcohol use, unspecified with unspecified alcohol-induced disorder: Secondary | ICD-10-CM

## 2017-01-12 DIAGNOSIS — Z79899 Other long term (current) drug therapy: Secondary | ICD-10-CM | POA: Diagnosis not present

## 2017-01-12 MED ORDER — HYDROXYZINE HCL 25 MG PO TABS: 25.0000 mg | ORAL_TABLET | Freq: Three times a day (TID) | ORAL | 0 refills | Status: DC | PRN

## 2017-01-12 MED ORDER — HYDROXYZINE HCL 25 MG PO TABS: 25.0000 mg | ORAL_TABLET | Freq: Three times a day (TID) | ORAL | 1 refills | Status: DC | PRN

## 2017-01-12 MED ORDER — ARIPIPRAZOLE 2 MG PO TABS: 2.0000 mg | ORAL_TABLET | Freq: Every day | ORAL | 1 refills | Status: DC

## 2017-01-12 MED ORDER — ARIPIPRAZOLE 2 MG PO TABS: 2.0000 mg | ORAL_TABLET | Freq: Every day | ORAL | 0 refills | Status: DC

## 2017-01-12 NOTE — Patient Instructions (Addendum)
1. Start Abilify 2 mg daily 2. Start hydroxyzine 25 mg three times a day as needed for anxiety 3. Return to clinic in two months

## 2017-01-16 DIAGNOSIS — B353 Tinea pedis: Secondary | ICD-10-CM | POA: Diagnosis not present

## 2017-01-17 ENCOUNTER — Ambulatory Visit
Admission: RE | Admit: 2017-01-17 | Discharge: 2017-01-17 | Disposition: A | Discharge status: Home / Self Care | Payer: Medicare Other | Source: Ambulatory Visit | Attending: Family Medicine | Admitting: Family Medicine

## 2017-01-17 DIAGNOSIS — N6009 Solitary cyst of unspecified breast: Secondary | ICD-10-CM

## 2017-01-17 DIAGNOSIS — R922 Inconclusive mammogram: Secondary | ICD-10-CM | POA: Diagnosis not present

## 2017-01-17 DIAGNOSIS — N6002 Solitary cyst of left breast: Secondary | ICD-10-CM

## 2017-01-17 DIAGNOSIS — N6001 Solitary cyst of right breast: Secondary | ICD-10-CM

## 2017-01-23 ENCOUNTER — Ambulatory Visit: Payer: Medicare Other | Admitting: Gastroenterology

## 2017-02-09 ENCOUNTER — Telehealth (HOSPITAL_COMMUNITY): Payer: Self-pay | Admitting: *Deleted

## 2017-02-09 NOTE — Telephone Encounter (Signed)
Received faxed request for refills of Vistaril and Abilify. Upon chart review, noticed a refill was on file for each. Called to notify pharmacy and they were able to get it to go through without problems. Apologized for the error. Nothing further required.

## 2017-02-11 ENCOUNTER — Emergency Department (HOSPITAL_COMMUNITY)
Admission: EM | Admit: 2017-02-11 | Discharge: 2017-02-11 | Disposition: A | Discharge status: Home / Self Care | Payer: Medicare Other | Attending: Emergency Medicine | Admitting: Emergency Medicine

## 2017-02-11 ENCOUNTER — Encounter (HOSPITAL_COMMUNITY): Payer: Self-pay | Admitting: *Deleted

## 2017-02-11 ENCOUNTER — Emergency Department (HOSPITAL_COMMUNITY): Payer: Medicare Other

## 2017-02-11 DIAGNOSIS — F1290 Cannabis use, unspecified, uncomplicated: Secondary | ICD-10-CM | POA: Diagnosis not present

## 2017-02-11 DIAGNOSIS — W109XXA Fall (on) (from) unspecified stairs and steps, initial encounter: Secondary | ICD-10-CM | POA: Insufficient documentation

## 2017-02-11 DIAGNOSIS — Y939 Activity, unspecified: Secondary | ICD-10-CM | POA: Insufficient documentation

## 2017-02-11 DIAGNOSIS — F17210 Nicotine dependence, cigarettes, uncomplicated: Secondary | ICD-10-CM | POA: Diagnosis not present

## 2017-02-11 DIAGNOSIS — Y929 Unspecified place or not applicable: Secondary | ICD-10-CM | POA: Diagnosis not present

## 2017-02-11 DIAGNOSIS — S92212A Displaced fracture of cuboid bone of left foot, initial encounter for closed fracture: Secondary | ICD-10-CM | POA: Diagnosis not present

## 2017-02-11 DIAGNOSIS — M79605 Pain in left leg: Secondary | ICD-10-CM | POA: Diagnosis not present

## 2017-02-11 DIAGNOSIS — S92002A Unspecified fracture of left calcaneus, initial encounter for closed fracture: Secondary | ICD-10-CM | POA: Diagnosis not present

## 2017-02-11 DIAGNOSIS — S8992XA Unspecified injury of left lower leg, initial encounter: Secondary | ICD-10-CM | POA: Diagnosis not present

## 2017-02-11 DIAGNOSIS — S92902A Unspecified fracture of left foot, initial encounter for closed fracture: Secondary | ICD-10-CM

## 2017-02-11 DIAGNOSIS — Y999 Unspecified external cause status: Secondary | ICD-10-CM | POA: Insufficient documentation

## 2017-02-11 DIAGNOSIS — Z79899 Other long term (current) drug therapy: Secondary | ICD-10-CM | POA: Insufficient documentation

## 2017-02-11 DIAGNOSIS — S99922A Unspecified injury of left foot, initial encounter: Secondary | ICD-10-CM | POA: Diagnosis present

## 2017-02-11 DIAGNOSIS — S92001A Unspecified fracture of right calcaneus, initial encounter for closed fracture: Secondary | ICD-10-CM | POA: Diagnosis not present

## 2017-02-11 MED ORDER — DICLOFENAC SODIUM 75 MG PO TBEC: 75.0000 mg | DELAYED_RELEASE_TABLET | Freq: Two times a day (BID) | ORAL | 0 refills | Status: DC

## 2017-02-11 NOTE — ED Triage Notes (Addendum)
Pt c/o left foot, ankle, lower leg, knee and thigh pain after falling down stairs 1 week ago. Pt denies LOC upon fall.

## 2017-02-11 NOTE — Discharge Instructions (Signed)
See the Orthopaedist for recheck in 1 week  °

## 2017-02-12 NOTE — ED Provider Notes (Signed)
[facility] DEPT Provider Note   CSN: [id number] Arrival date & time: 02/11/17  1548     History   Chief Complaint Chief Complaint  Patient presents with  . Fall    HPI Regina Carson is a 51 y.o. female.  The history is provided by the patient. No language interpreter was used.  Fall  This is a new problem. The current episode started more than 1 week ago. The problem has not changed since onset.Nothing aggravates the symptoms. Nothing relieves the symptoms. She has tried nothing for the symptoms. The treatment provided no relief.  Pt complains of pain in her foot and leg.  Pt reports she injured one week ago.  Pt complains of continued swelling  Past Medical History:  Diagnosis Date  . Anxiety   . Arthritis   . Bipolar 1 disorder ([place])   . Depression   . Fibrocystic breast disease   . Hyperlipidemia   . Schizoaffective disorder ([place])   . Tobacco abuse 12/12/2016    Patient Active Problem List   Diagnosis Date Noted  . Drug-seeking behavior 01/03/2017  . Marijuana use, episodic 01/03/2017  . Tinea manuum 12/19/2016  . Tinea pedis of both feet 12/19/2016  . Ventral hernia 12/19/2016  . Anxiety disorder 12/12/2016  . Constipation, chronic 12/12/2016  . Colon polyps 12/12/2016  . Tinnitus, left 12/12/2016  . Dental caries 12/12/2016  . Tobacco abuse 12/12/2016    Past Surgical History:  Procedure Laterality Date  . ABDOMINAL HYSTERECTOMY  1988   class 4 pap smear  . APPENDECTOMY  2015    OB History    No data available       Home Medications    Prior to Admission medications   Medication Sig Start Date End Date Taking? Authorizing Provider  amoxicillin-clavulanate (AUGMENTIN) 875-125 MG tablet Take 1 tablet by mouth 2 (two) times daily. 12/19/16   Raylene Everts, MD  ARIPiprazole (ABILIFY) 2 MG tablet Take 1 tablet (2 mg total) by mouth daily. 01/12/17   Norman Clay, MD  calcium-vitamin D (OSCAL WITH D) 500-200 MG-UNIT tablet Take 1 tablet by  mouth.    Historical Provider, MD  diclofenac (VOLTAREN) 75 MG EC tablet Take 1 tablet (75 mg total) by mouth 2 (two) times daily. 02/11/17   Fransico Meadow, PA-C  hydrOXYzine (ATARAX/VISTARIL) 25 MG tablet Take 1 tablet (25 mg total) by mouth 3 (three) times daily as needed. 01/12/17   Norman Clay, MD  IRON PO Take by mouth.    Historical Provider, MD  potassium gluconate 595 (99 K) MG TABS tablet Take 595 mg by mouth.    Historical Provider, MD  Vitamin D, Ergocalciferol, (DRISDOL) 50000 units CAPS capsule Take 1 capsule (50,000 Units total) by mouth every 7 (seven) days. 12/27/16   Raylene Everts, MD    Family History Family History  Problem Relation Age of Onset  . Hyperlipidemia Mother   . Hypertension Mother   . Liver cancer Father   . Alcohol abuse Father   . Mental illness Sister   . Mental illness Brother     Social History Social History  Substance Use Topics  . Smoking status: Heavy Tobacco Smoker    Packs/day: 0.50    Types: Cigarettes  . Smokeless tobacco: Never Used  . Alcohol use Yes     Comment: social , 01-12-2017 per pt yes on the Weekends     Allergies   Patient has no known allergies.   Review of Systems  Review of Systems  All other systems reviewed and are negative.    Physical Exam Updated Vital Signs BP 107/82 (BP Location: Right Arm)   Pulse 69   Temp 98.4 F (36.9 C) (Oral)   Resp 18   Ht 5\' 2"  (1.575 m)   Wt 83 kg   SpO2 100%   BMI 33.47 kg/m   Physical Exam  Constitutional: She is oriented to person, place, and time. She appears well-developed and well-nourished.  HENT:  Head: Normocephalic and atraumatic.  Musculoskeletal: She exhibits tenderness. She exhibits no deformity.  Neurological: She is alert and oriented to person, place, and time.  Skin: Skin is warm.  Psychiatric: She has a normal mood and affect.  Nursing note and vitals reviewed.    ED Treatments / Results  Labs (all labs ordered are listed, but only abnormal  results are displayed) Labs Reviewed - No data to display  EKG  EKG Interpretation None       Radiology Dg Tibia/fibula Left  Result Date: 02/11/2017 CLINICAL DATA:  Fall down stairs 1 week ago. Left foot, ankle, lower leg, knee, and thigh pain. Initial encounter. EXAM: LEFT TIBIA AND FIBULA - 2 VIEW COMPARISON:  None. FINDINGS: The tibia and fibula appear intact without evidence of acute fracture or suspicious osseous lesion. The knee and ankle are grossly located. No significant soft tissue abnormality is seen. IMPRESSION: Negative. Electronically Signed   By: Logan Bores M.D.   On: 02/11/2017 17:30   Dg Foot Complete Left  Result Date: 02/11/2017 CLINICAL DATA:  Fall down stairs 1 week ago. Left foot, ankle, lower leg, knee, and thigh pain. Initial encounter. EXAM: LEFT FOOT - COMPLETE 3+ VIEW COMPARISON:  None. FINDINGS: There is a small avulsion fracture involving the proximal lateral aspect of the cuboid. Additional nearby small osseous fragments at the anterolateral aspect of the calcaneus may reflect an avulsion fracture of the calcaneus or distracted cuboid fragments. There is overlying soft tissue swelling. There is no dislocation. Bone mineralization appears normal. IMPRESSION: Small lateral calcaneocuboid avulsion type fractures. Electronically Signed   By: Logan Bores M.D.   On: 02/11/2017 17:38    Procedures Procedures (including critical care time)  Medications Ordered in ED Medications - No data to display   Initial Impression / Assessment and Plan / ED Course  I have reviewed the triage vital signs and the nursing notes.  Pertinent labs & imaging results that were available during my care of the patient were reviewed by me and considered in my medical decision making (see chart for details).       Final Clinical Impressions(s) / ED Diagnoses   Final diagnoses:  Unspecified fracture of left foot, initial encounter for closed fracture    New  Prescriptions Discharge Medication List as of 02/11/2017  6:36 PM    START taking these medications   Details  diclofenac (VOLTAREN) 75 MG EC tablet Take 1 tablet (75 mg total) by mouth 2 (two) times daily., Starting Sat 02/11/2017, Print      An After Visit Summary was printed and given to the patient.    Fransico Meadow, PA-C 02/12/17 [place], DO 02/14/17 2005

## 2017-02-14 ENCOUNTER — Ambulatory Visit: Payer: Medicare Other | Admitting: Gastroenterology

## 2017-02-14 ENCOUNTER — Encounter: Payer: Self-pay | Admitting: Gastroenterology

## 2017-02-14 NOTE — Telephone Encounter (Signed)
noted 

## 2017-02-21 ENCOUNTER — Ambulatory Visit: Payer: Self-pay | Admitting: Orthopaedic Surgery

## 2017-02-28 ENCOUNTER — Ambulatory Visit (INDEPENDENT_AMBULATORY_CARE_PROVIDER_SITE_OTHER): Payer: Medicare Other | Admitting: Orthopaedic Surgery

## 2017-02-28 ENCOUNTER — Encounter: Payer: Self-pay | Admitting: Orthopaedic Surgery

## 2017-02-28 VITALS — BP 111/75 | HR 74 | Temp 97.7°F | Ht 62.0 in | Wt 198.0 lb

## 2017-02-28 DIAGNOSIS — F17210 Nicotine dependence, cigarettes, uncomplicated: Secondary | ICD-10-CM

## 2017-02-28 DIAGNOSIS — S92215A Nondisplaced fracture of cuboid bone of left foot, initial encounter for closed fracture: Secondary | ICD-10-CM | POA: Diagnosis not present

## 2017-02-28 DIAGNOSIS — S92035A Nondisplaced avulsion fracture of tuberosity of left calcaneus, initial encounter for closed fracture: Secondary | ICD-10-CM | POA: Diagnosis not present

## 2017-02-28 NOTE — Patient Instructions (Signed)
Steps to Quit Smoking Smoking tobacco can be bad for your health. It can also affect almost every organ in your body. Smoking puts you and people around you at risk for many serious long-lasting (chronic) diseases. Quitting smoking is hard, but it is one of the best things that you can do for your health. It is never too late to quit. What are the benefits of quitting smoking? When you quit smoking, you lower your risk for getting serious diseases and conditions. They can include:  Lung cancer or lung disease.  Heart disease.  Stroke.  Heart attack.  Not being able to have children (infertility).  Weak bones (osteoporosis) and broken bones (fractures). If you have coughing, wheezing, and shortness of breath, those symptoms may get better when you quit. You may also get sick less often. If you are pregnant, quitting smoking can help to lower your chances of having a baby of low birth weight. What can I do to help me quit smoking? Talk with your doctor about what can help you quit smoking. Some things you can do (strategies) include:  Quitting smoking totally, instead of slowly cutting back how much you smoke over a period of time.  Going to in-person counseling. You are more likely to quit if you go to many counseling sessions.  Using resources and support systems, such as:  Online chats with a counselor.  Phone quitlines.  Printed self-help materials.  Support groups or group counseling.  Text messaging programs.  Mobile phone apps or applications.  Taking medicines. Some of these medicines may have nicotine in them. If you are pregnant or breastfeeding, do not take any medicines to quit smoking unless your doctor says it is okay. Talk with your doctor about counseling or other things that can help you. Talk with your doctor about using more than one strategy at the same time, such as taking medicines while you are also going to in-person counseling. This can help make quitting  easier. What things can I do to make it easier to quit? Quitting smoking might feel very hard at first, but there is a lot that you can do to make it easier. Take these steps:  Talk to your family and friends. Ask them to support and encourage you.  Call phone quitlines, reach out to support groups, or work with a counselor.  Ask people who smoke to not smoke around you.  Avoid places that make you want (trigger) to smoke, such as:  Bars.  Parties.  Smoke-break areas at work.  Spend time with people who do not smoke.  Lower the stress in your life. Stress can make you want to smoke. Try these things to help your stress:  Getting regular exercise.  Deep-breathing exercises.  Yoga.  Meditating.  Doing a body scan. To do this, close your eyes, focus on one area of your body at a time from head to toe, and notice which parts of your body are tense. Try to relax the muscles in those areas.  Download or buy apps on your mobile phone or tablet that can help you stick to your quit plan. There are many free apps, such as QuitGuide from the CDC (Centers for Disease Control and Prevention). You can find more support from smokefree.gov and other websites. This information is not intended to replace advice given to you by your health care provider. Make sure you discuss any questions you have with your health care provider. Document Released: 08/27/2009 Document Revised: 06/28/2016 Document   Reviewed: 03/17/2015 Elsevier Interactive Patient Education  2017 Elsevier Inc.  

## 2017-02-28 NOTE — Progress Notes (Signed)
Subjective:    Patient ID: Regina Carson, female    DOB: [birth year], 51 y.o.   MRN: [id number]  HPI She fell three weeks ago and hurt her left foot. She was seen in the ER a week later.  X-rays show avulsion fractures of the calcaneus and cuboid.  She has cane and CAM walker.  She is better but still hurts.  Her swelling is decreased. She has used elevation and heat. She has no other injury.  She does not like ice.  She smokes and is willing to cut back.   Review of Systems  HENT: Negative for congestion.   Respiratory: Negative for cough and shortness of breath.   Cardiovascular: Negative for chest pain and leg swelling.  Endocrine: Positive for cold intolerance.  Musculoskeletal: Positive for arthralgias, gait problem and joint swelling.  Allergic/Immunologic: Positive for environmental allergies.  Psychiatric/Behavioral: The patient is nervous/anxious.    Past Medical History:  Diagnosis Date  . Anxiety   . Arthritis   . Bipolar 1 disorder ([place])   . Depression   . Fibrocystic breast disease   . Hyperlipidemia   . Schizoaffective disorder ([place])   . Tobacco abuse 12/12/2016    Past Surgical History:  Procedure Laterality Date  . ABDOMINAL HYSTERECTOMY  1988   class 4 pap smear  . APPENDECTOMY  2015    Current Outpatient Prescriptions on File Prior to Visit  Medication Sig Dispense Refill  . amoxicillin-clavulanate (AUGMENTIN) 875-125 MG tablet Take 1 tablet by mouth 2 (two) times daily. 20 tablet 0  . ARIPiprazole (ABILIFY) 2 MG tablet Take 1 tablet (2 mg total) by mouth daily. 30 tablet 1  . calcium-vitamin D (OSCAL WITH D) 500-200 MG-UNIT tablet Take 1 tablet by mouth.    . diclofenac (VOLTAREN) 75 MG EC tablet Take 1 tablet (75 mg total) by mouth 2 (two) times daily. 20 tablet 0  . hydrOXYzine (ATARAX/VISTARIL) 25 MG tablet Take 1 tablet (25 mg total) by mouth 3 (three) times daily as needed. 90 tablet 1  . IRON PO Take by mouth.    . potassium gluconate 595  (99 K) MG TABS tablet Take 595 mg by mouth.    . Vitamin D, Ergocalciferol, (DRISDOL) 50000 units CAPS capsule Take 1 capsule (50,000 Units total) by mouth every 7 (seven) days. 12 capsule 0   No current facility-administered medications on file prior to visit.     Social History   Social History  . Marital status: Single    Spouse name: N/A  . Number of children: 1  . Years of education: 25   Occupational History  . disabled     mental   Social History Main Topics  . Smoking status: Heavy Tobacco Smoker    Packs/day: 0.50    Types: Cigarettes  . Smokeless tobacco: Never Used  . Alcohol use Yes     Comment: social , 01-12-2017 per pt yes on the Weekends  . Drug use: Yes    Frequency: 1.0 time per week    Types: Marijuana     Comment: last used this past week as of 02/11/17  . Sexual activity: Not Currently    Birth control/ protection: Surgical   Other Topics Concern  . Not on file   Social History Narrative   Disabled mental illness   Lives alone    Family History  Problem Relation Age of Onset  . Hyperlipidemia Mother   . Hypertension Mother   . Liver cancer Father   .  Alcohol abuse Father   . Mental illness Sister   . Mental illness Brother     BP 111/75   Pulse 74   Temp 97.7 F (36.5 C)   Ht 5\' 2"  (1.575 m)   Wt 198 lb (89.8 kg)   BMI 36.21 kg/m      Objective:   Physical Exam  Constitutional: She is oriented to person, place, and time. She appears well-developed and well-nourished.  HENT:  Head: Normocephalic and atraumatic.  Eyes: Conjunctivae and EOM are normal. Pupils are equal, round, and reactive to light.  Neck: Normal range of motion. Neck supple.  Cardiovascular: Normal rate, regular rhythm and intact distal pulses.   Pulmonary/Chest: Effort normal.  Abdominal: Soft.  Musculoskeletal: She exhibits tenderness (left lateral foot tender with slight swelling, no ecchymosis, limp to the left, NV intact.  ankle with full ROM.).   Neurological: She is alert and oriented to person, place, and time. She displays normal reflexes. No cranial nerve deficit. She exhibits normal muscle tone. Coordination normal.  Skin: Skin is warm and dry.  Psychiatric: She has a normal mood and affect. Her behavior is normal. Judgment and thought content normal.  Vitals reviewed.         Assessment & Plan:   Encounter Diagnoses  Name Primary?  . Closed nondisplaced avulsion fracture of tuberosity of left calcaneus, initial encounter Yes  . Closed nondisplaced fracture of cuboid of left foot, initial encounter   . Cigarette nicotine dependence without complication    Continue the CAM walker and cane.  Contrast bath sheet of instructions given.  Return in three weeks.  X-rays of the left foot on return.  Call if any problem.  Precautions discussed.  Electronically Signed Sanjuana Kava, MD 4/17/20182:21 PM
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
## 2017-03-19 ENCOUNTER — Other Ambulatory Visit: Payer: Self-pay | Admitting: Family Medicine

## 2017-03-20 ENCOUNTER — Ambulatory Visit: Payer: Medicare Other | Admitting: Family Medicine

## 2017-03-20 ENCOUNTER — Ambulatory Visit (HOSPITAL_COMMUNITY): Payer: Medicare Other | Admitting: Psychiatry

## 2017-03-20 ENCOUNTER — Ambulatory Visit: Payer: Self-pay | Admitting: Family Medicine

## 2017-03-20 DIAGNOSIS — E559 Vitamin D deficiency, unspecified: Secondary | ICD-10-CM | POA: Insufficient documentation

## 2017-03-21 ENCOUNTER — Ambulatory Visit (INDEPENDENT_AMBULATORY_CARE_PROVIDER_SITE_OTHER): Payer: Medicare Other

## 2017-03-21 ENCOUNTER — Encounter: Payer: Self-pay | Admitting: Orthopaedic Surgery

## 2017-03-21 ENCOUNTER — Ambulatory Visit (INDEPENDENT_AMBULATORY_CARE_PROVIDER_SITE_OTHER): Payer: Self-pay | Admitting: Orthopaedic Surgery

## 2017-03-21 DIAGNOSIS — S92035D Nondisplaced avulsion fracture of tuberosity of left calcaneus, subsequent encounter for fracture with routine healing: Secondary | ICD-10-CM

## 2017-03-21 NOTE — Progress Notes (Signed)
CC:  I want to stop the boot  She is doing well with the left foot.  She has less pain and swelling.  I will have her stop the CAM walker.  X-rays done today, reported separately.  NV intact.  She has slight lateral swelling of the left foot.  She uses a cane.  Encounter Diagnosis  Name Primary?  . Closed nondisplaced avulsion fracture of tuberosity of left calcaneus with routine healing, subsequent encounter Yes   Return in two weeks.  No x-rays on return.  Call if any problem.  Electronically Signed Sanjuana Kava, MD 5/8/20182:58 PM

## 2017-03-23 ENCOUNTER — Ambulatory Visit: Payer: Self-pay | Admitting: Family Medicine

## 2017-03-28 ENCOUNTER — Ambulatory Visit (INDEPENDENT_AMBULATORY_CARE_PROVIDER_SITE_OTHER): Payer: Medicare Other | Admitting: Family Medicine

## 2017-03-28 ENCOUNTER — Encounter: Payer: Self-pay | Admitting: Family Medicine

## 2017-03-28 VITALS — BP 102/60 | HR 76 | Temp 97.4°F | Resp 18 | Ht 62.0 in | Wt 198.1 lb

## 2017-03-28 DIAGNOSIS — N760 Acute vaginitis: Secondary | ICD-10-CM | POA: Diagnosis not present

## 2017-03-28 DIAGNOSIS — B9689 Other specified bacterial agents as the cause of diseases classified elsewhere: Secondary | ICD-10-CM

## 2017-03-28 DIAGNOSIS — R142 Eructation: Secondary | ICD-10-CM

## 2017-03-28 DIAGNOSIS — Z720 Tobacco use: Secondary | ICD-10-CM

## 2017-03-28 DIAGNOSIS — K5909 Other constipation: Secondary | ICD-10-CM | POA: Diagnosis not present

## 2017-03-28 DIAGNOSIS — K029 Dental caries, unspecified: Secondary | ICD-10-CM

## 2017-03-28 DIAGNOSIS — R143 Flatulence: Secondary | ICD-10-CM

## 2017-03-28 DIAGNOSIS — R141 Gas pain: Secondary | ICD-10-CM

## 2017-03-28 MED ORDER — METRONIDAZOLE 0.75 % VA GEL: 1.0000 | Freq: Two times a day (BID) | VAGINAL | 0 refills | Status: DC

## 2017-03-28 MED ORDER — POLYETHYLENE GLYCOL 3350 17 GM/SCOOP PO POWD: 17.0000 g | Freq: Every day | ORAL | 11 refills | Status: DC

## 2017-03-28 NOTE — Progress Notes (Signed)
Chief Complaint  Patient presents with  . Follow-up   ROUTINE FOLLOW UP Complains of chronic constipation, uses laxatives regularly.  Discussed water, activity, fiber diet.  Prescribed miralax Complains of increased gas pain lately.  Discussed foods Finished her Vitamin D high dose.  Needs to take OTC Seeing psychiatry for anxiety.  A little more controlled today Has a pink malodorous vaginal discharge.  Will treat for BV Has reduced smoking to 1 pack per week   Patient Active Problem List   Diagnosis Date Noted  . Vitamin D deficiency 03/20/2017  . Drug-seeking behavior 01/03/2017  . Marijuana use, episodic 01/03/2017  . Tinea manuum 12/19/2016  . Tinea pedis of both feet 12/19/2016  . Ventral hernia 12/19/2016  . Anxiety disorder 12/12/2016  . Constipation, chronic 12/12/2016  . Colon polyps 12/12/2016  . Tinnitus, left 12/12/2016  . Dental caries 12/12/2016  . Tobacco abuse 12/12/2016    Outpatient Encounter Prescriptions as of 03/28/2017  Medication Sig  . ARIPiprazole (ABILIFY) 2 MG tablet Take 1 tablet (2 mg total) by mouth daily.  . calcium-vitamin D (OSCAL WITH D) 500-200 MG-UNIT tablet Take 1 tablet by mouth.  . diclofenac (VOLTAREN) 75 MG EC tablet Take 1 tablet (75 mg total) by mouth 2 (two) times daily.  . hydrOXYzine (ATARAX/VISTARIL) 25 MG tablet Take 1 tablet (25 mg total) by mouth 3 (three) times daily as needed.  . IRON PO Take by mouth.  . potassium gluconate 595 (99 K) MG TABS tablet Take 595 mg by mouth.  . Vitamin D, Ergocalciferol, (DRISDOL) 50000 units CAPS capsule Take 1 capsule (50,000 Units total) by mouth every 7 (seven) days.  . metroNIDAZOLE (METROGEL VAGINAL) 0.75 % vaginal gel Place 1 Applicatorful vaginally 2 (two) times daily.  . polyethylene glycol powder (GLYCOLAX/MIRALAX) powder Take 17 g by mouth daily.  . [DISCONTINUED] amoxicillin-clavulanate (AUGMENTIN) 875-125 MG tablet Take 1 tablet by mouth 2 (two) times daily. (Patient not  taking: Reported on 03/28/2017)   No facility-administered encounter medications on file as of 03/28/2017.     No Known Allergies  Review of Systems  Constitutional: Negative for fatigue and unexpected weight change.  HENT: Positive for dental problem.   Eyes: Negative for photophobia and visual disturbance.  Respiratory: Negative for cough and shortness of breath.   Cardiovascular: Negative for chest pain and leg swelling.  Gastrointestinal: Positive for abdominal distention, abdominal pain and constipation.  Genitourinary: Positive for vaginal discharge. Negative for difficulty urinating and dyspareunia.  Musculoskeletal: Negative for arthralgias and back pain.  Neurological: Negative for dizziness and headaches.  Psychiatric/Behavioral: Negative for behavioral problems and dysphoric mood. The patient is not nervous/anxious.     BP 102/60 (BP Location: Right Arm, Patient Position: Sitting, Cuff Size: Normal)   Pulse 76   Temp 97.4 F (36.3 C) (Temporal)   Resp 18   Ht 5\' 2"  (1.575 m)   Wt 198 lb 1.9 oz (89.9 kg)   SpO2 98%   BMI 36.24 kg/m   Physical Exam  Constitutional: She is oriented to person, place, and time. She appears well-developed and well-nourished.  HENT:  Head: Normocephalic and atraumatic.  Right Ear: External ear normal.  Left Ear: External ear normal.  Mouth/Throat: Oropharynx is clear and moist.  Fractured, caries teeth  Eyes: Conjunctivae are normal. Pupils are equal, round, and reactive to light.  Neck: Normal range of motion. Neck supple. No thyromegaly present.  Cardiovascular: Normal rate, regular rhythm and normal heart sounds.   Pulmonary/Chest: Effort normal  and breath sounds normal. No respiratory distress.  Abdominal: Soft. Bowel sounds are normal. She exhibits no distension. There is no tenderness.  Musculoskeletal: Normal range of motion. She exhibits no edema.  Lymphadenopathy:    She has no cervical adenopathy.  Neurological: She is alert  and oriented to person, place, and time.  Gait normal  Psychiatric: She has a normal mood and affect. Her behavior is normal. Thought content normal.  Energetic, not as distracted  Nursing note and vitals reviewed.   ASSESSMENT/PLAN:  1. Tobacco abuse reducing  2. Dental caries Referred to dental  3. Constipation, chronic Add mitralax  4. BV (bacterial vaginosis)t Trial of metro gel  5. Flatulence, eructation and gas pain Gave handout of foods to avoid   Patient Instructions   I will give you a list of foods that cause gas You can take simethicone for the gas ( over the counter ) Take vitamin D 2000 units a day I think the vaginal spotting is irritation or infection I am prescribing a gel to use, let me know if it does not clear up Take miralax daily with a glass of water   See me in 3 months Call sooner for rpoblems         Raylene Everts, MD

## 2017-03-28 NOTE — Patient Instructions (Addendum)
  I will give you a list of foods that cause gas You can take simethicone for the gas ( over the counter ) Take vitamin D 2000 units a day I think the vaginal spotting is irritation or infection I am prescribing a gel to use, let me know if it does not clear up Take miralax daily with a glass of water   See me in 3 months Call sooner for rpoblems

## 2017-03-31 ENCOUNTER — Telehealth: Payer: Self-pay

## 2017-03-31 NOTE — Telephone Encounter (Signed)
Called pt to schedule AWV - nr
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
## 2017-04-04 ENCOUNTER — Ambulatory Visit (INDEPENDENT_AMBULATORY_CARE_PROVIDER_SITE_OTHER): Payer: Self-pay | Admitting: Orthopaedic Surgery

## 2017-04-04 DIAGNOSIS — S92035D Nondisplaced avulsion fracture of tuberosity of left calcaneus, subsequent encounter for fracture with routine healing: Secondary | ICD-10-CM

## 2017-04-04 NOTE — Progress Notes (Signed)
CC:  My foot is OK  She is walking with no pain, has no problem with the left foot.   NV intact.  She has full motion.  Encounter Diagnosis  Name Primary?  . Closed nondisplaced avulsion fracture of tuberosity of left calcaneus with routine healing, subsequent encounter Yes   Discharge.  Electronically Signed Sanjuana Kava, MD 5/22/20182:25 PM

## 2017-04-06 ENCOUNTER — Encounter (HOSPITAL_COMMUNITY): Payer: Self-pay | Admitting: Psychiatry

## 2017-04-06 ENCOUNTER — Ambulatory Visit (INDEPENDENT_AMBULATORY_CARE_PROVIDER_SITE_OTHER): Payer: Medicare Other | Admitting: Psychiatry

## 2017-04-06 VITALS — BP 111/73 | HR 95 | Ht 62.0 in | Wt 200.0 lb

## 2017-04-06 DIAGNOSIS — Z810 Family history of intellectual disabilities: Secondary | ICD-10-CM

## 2017-04-06 DIAGNOSIS — F17210 Nicotine dependence, cigarettes, uncomplicated: Secondary | ICD-10-CM

## 2017-04-06 DIAGNOSIS — F1221 Cannabis dependence, in remission: Secondary | ICD-10-CM | POA: Diagnosis not present

## 2017-04-06 DIAGNOSIS — Z79899 Other long term (current) drug therapy: Secondary | ICD-10-CM | POA: Diagnosis not present

## 2017-04-06 DIAGNOSIS — F3181 Bipolar II disorder: Secondary | ICD-10-CM

## 2017-04-06 DIAGNOSIS — Z818 Family history of other mental and behavioral disorders: Secondary | ICD-10-CM

## 2017-04-06 MED ORDER — LAMOTRIGINE 25 MG PO TABS: ORAL_TABLET | ORAL | 1 refills | Status: DC

## 2017-04-06 NOTE — Patient Instructions (Signed)
1. Start lamotrigine 25 mg daily for two weeks, then 50 mg daily 2. Return to clinic in two months for 30 mins

## 2017-05-01 IMAGING — MG MM DIGITAL SCREENING BILAT
5 series · 5 of 5 positions shown · non-contrast
Comparison: Previous exam(s).

CLINICAL DATA: Screening.

EXAM:
DIGITAL SCREENING BILATERAL MAMMOGRAM WITH CAD

[R CC]
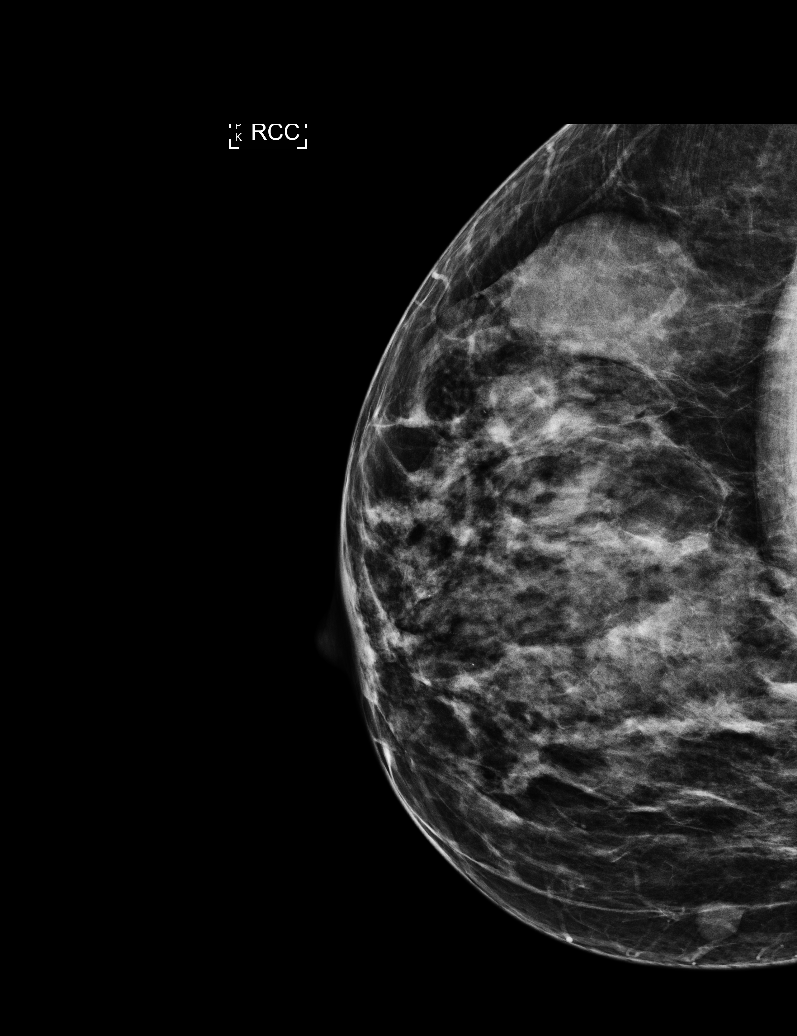

[R MLO (1 of 2)]
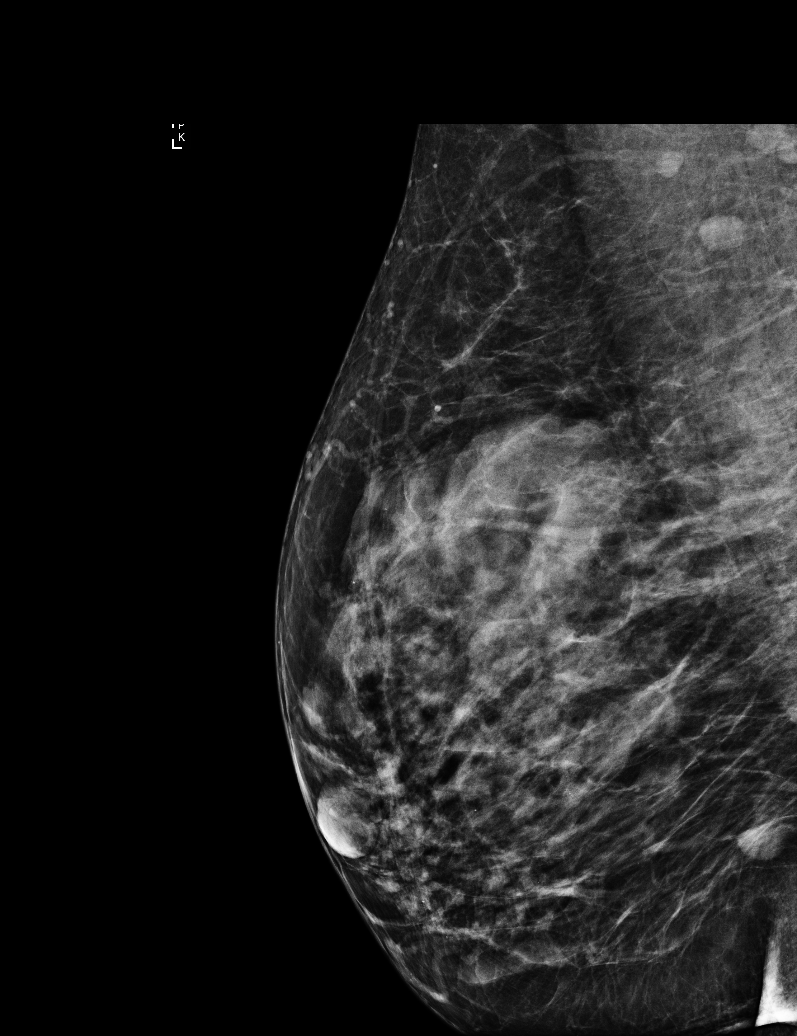

[R MLO (2 of 2)]
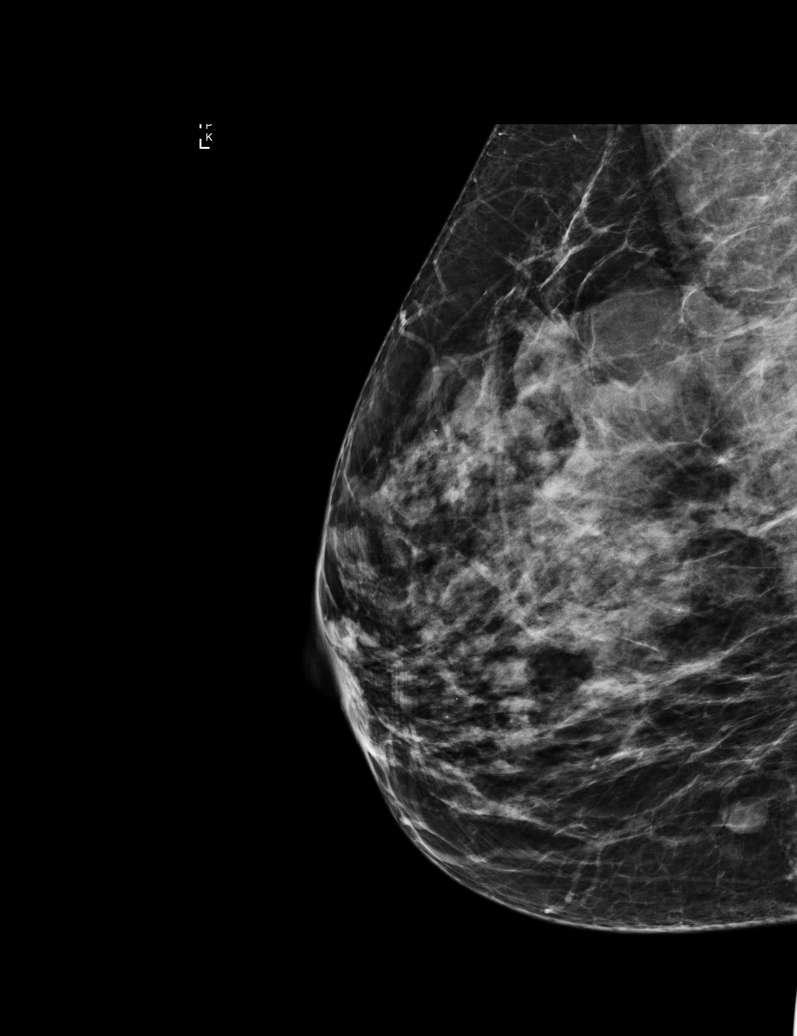

[L MLO]
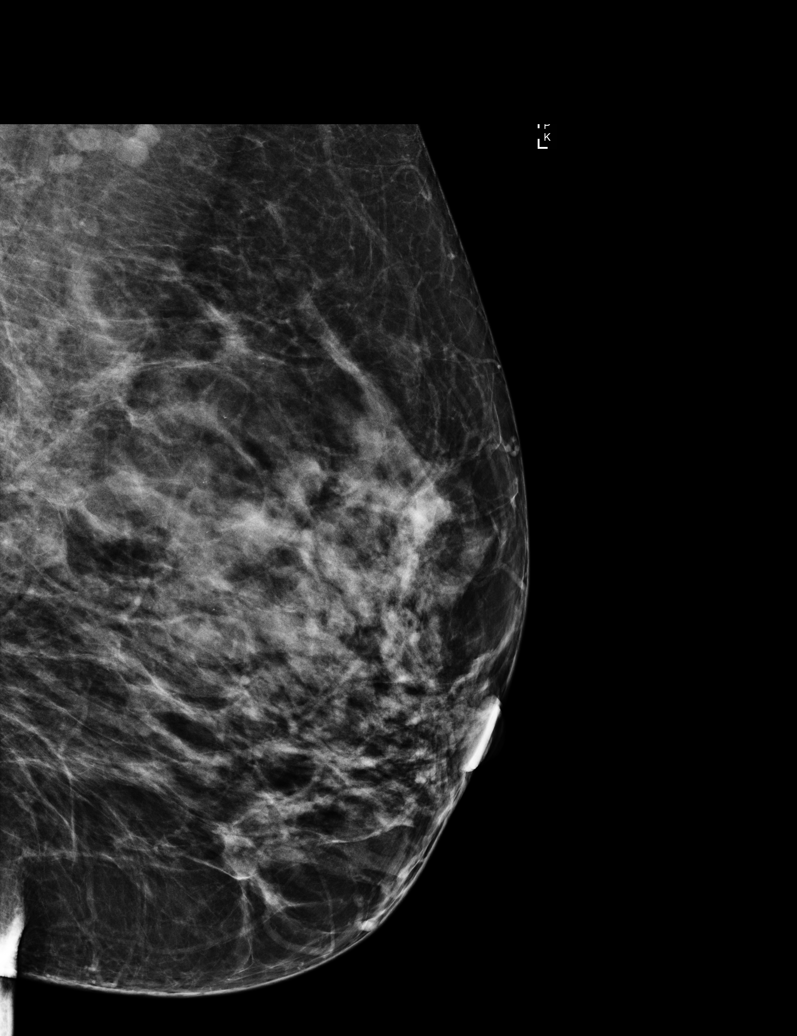

[L CC]
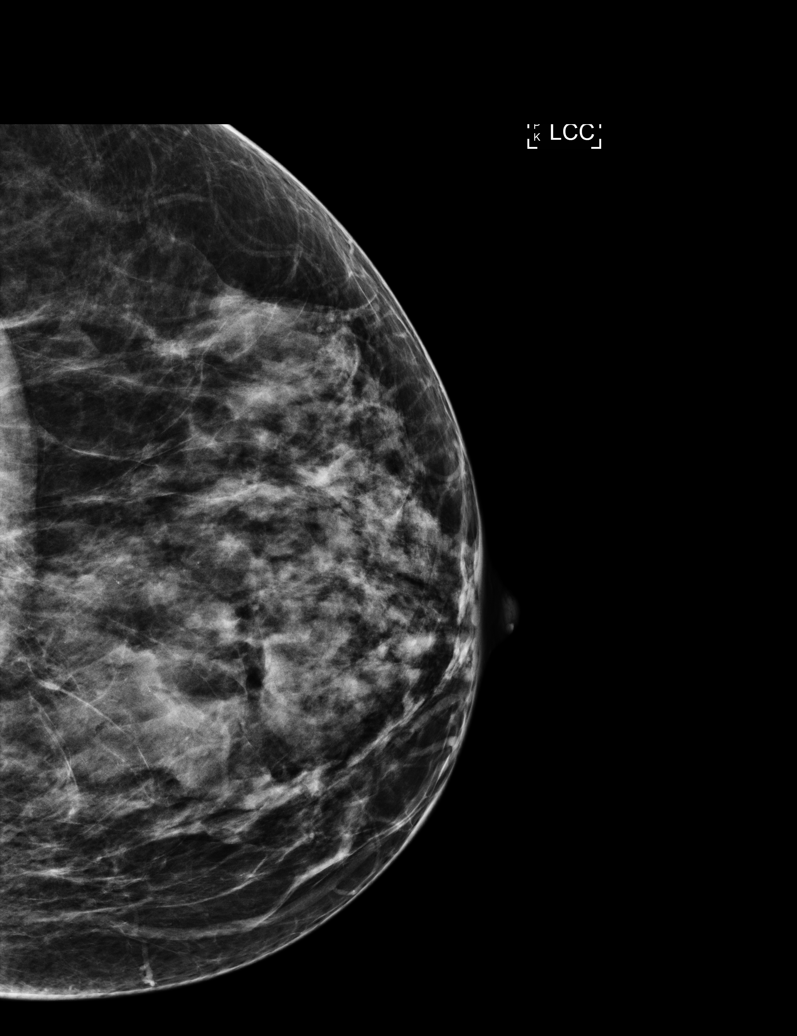

[5 of 5 positions shown; findings below may reference images not displayed]

ACR Breast Density Category c: The breast tissue is heterogeneously
dense, which may obscure small masses.
FINDINGS: In the right breast, 2 possible masses warrant further evaluation.
In the left breast, no findings suspicious for malignancy. Images
were processed with CAD.
IMPRESSION: Further evaluation is suggested for possible masses in the right
breast.

RECOMMENDATION:
Diagnostic mammogram and possibly ultrasound of the right breast.
(Code:[id number])

The patient will be contacted regarding the findings, and additional
imaging will be scheduled.

BI-RADS CATEGORY  0: Incomplete. Need additional imaging evaluation
and/or prior mammograms for comparison.
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
## 2017-06-16 ENCOUNTER — Ambulatory Visit (INDEPENDENT_AMBULATORY_CARE_PROVIDER_SITE_OTHER): Payer: Medicare Other | Admitting: Psychiatry

## 2017-06-16 ENCOUNTER — Encounter (HOSPITAL_COMMUNITY): Payer: Self-pay | Admitting: Psychiatry

## 2017-06-16 VITALS — BP 99/70 | HR 103 | Ht 62.0 in | Wt 197.0 lb

## 2017-06-16 DIAGNOSIS — Z811 Family history of alcohol abuse and dependence: Secondary | ICD-10-CM | POA: Diagnosis not present

## 2017-06-16 DIAGNOSIS — Z818 Family history of other mental and behavioral disorders: Secondary | ICD-10-CM | POA: Diagnosis not present

## 2017-06-16 DIAGNOSIS — G4700 Insomnia, unspecified: Secondary | ICD-10-CM

## 2017-06-16 DIAGNOSIS — F17210 Nicotine dependence, cigarettes, uncomplicated: Secondary | ICD-10-CM

## 2017-06-16 DIAGNOSIS — F1290 Cannabis use, unspecified, uncomplicated: Secondary | ICD-10-CM

## 2017-06-16 DIAGNOSIS — F3181 Bipolar II disorder: Secondary | ICD-10-CM

## 2017-06-16 MED ORDER — DIVALPROEX SODIUM ER 500 MG PO TB24: 500.0000 mg | ORAL_TABLET | Freq: Every day | ORAL | 1 refills | Status: DC

## 2017-06-16 NOTE — Patient Instructions (Signed)
1. Discontinue lamotrigine 2. Start depakote 500 mg at night 3. Return to clinic in five weeks for 30 mins 4. Referral to therapy 5. Obtain record from [facility]

## 2017-06-29 ENCOUNTER — Ambulatory Visit (INDEPENDENT_AMBULATORY_CARE_PROVIDER_SITE_OTHER): Payer: Medicare Other | Admitting: Family Medicine

## 2017-06-29 ENCOUNTER — Encounter: Payer: Self-pay | Admitting: Family Medicine

## 2017-06-29 VITALS — BP 130/76 | HR 100 | Temp 98.0°F | Resp 18 | Ht 62.0 in | Wt 196.2 lb

## 2017-06-29 DIAGNOSIS — N951 Menopausal and female climacteric states: Secondary | ICD-10-CM

## 2017-06-29 DIAGNOSIS — S161XXA Strain of muscle, fascia and tendon at neck level, initial encounter: Secondary | ICD-10-CM | POA: Diagnosis not present

## 2017-06-29 DIAGNOSIS — Z720 Tobacco use: Secondary | ICD-10-CM | POA: Diagnosis not present

## 2017-06-29 DIAGNOSIS — F319 Bipolar disorder, unspecified: Secondary | ICD-10-CM | POA: Diagnosis not present

## 2017-06-29 MED ORDER — NAPROXEN 500 MG PO TABS: 500.0000 mg | ORAL_TABLET | Freq: Two times a day (BID) | ORAL | 0 refills | Status: DC

## 2017-06-29 MED ORDER — CYCLOBENZAPRINE HCL 5 MG PO TABS: 5.0000 mg | ORAL_TABLET | Freq: Three times a day (TID) | ORAL | 1 refills | Status: AC | PRN

## 2017-06-29 NOTE — Progress Notes (Signed)
Chief Complaint  Patient presents with  . Follow-up   Routine visit She has two new complaints 1. Hot flashes.  Mostly at night.  Some irritability but cannot tell if it is her bipolar illness and usual moodiness.  She had a hysterectomy 20 y ago but still has ovaries.  Wants medicine, but not premarin, to make it go away. 2. Neck pain.  Spasm in the neck and upper back muscles.  Pain with turning head and shrugging R shoulder.  Trouble sleeping.  No trauma.  No overuse or new exercise.  Had this a couple of years ago.  Has not taken any medicine.  Has used ice and heat and "horse liniment".  Bipolar illness not well controlled.  Is on Depakote - does not feel it is working.  Still mentions needing clonopin.    Patient Active Problem List   Diagnosis Date Noted  . Bipolar II disorder ([place]) 06/16/2017  . Vitamin D deficiency 03/20/2017  . Drug-seeking behavior 01/03/2017  . Marijuana use, episodic 01/03/2017  . Tinea manuum 12/19/2016  . Tinea pedis of both feet 12/19/2016  . Ventral hernia 12/19/2016  . Anxiety disorder 12/12/2016  . Constipation, chronic 12/12/2016  . Colon polyps 12/12/2016  . Tinnitus, left 12/12/2016  . Dental caries 12/12/2016  . Tobacco abuse 12/12/2016    Outpatient Encounter Prescriptions as of 06/29/2017  Medication Sig  . Cholecalciferol (D3 DOTS) 2000 units TBDP Take by mouth.  . divalproex (DEPAKOTE ER) 500 MG 24 hr tablet Take 1 tablet (500 mg total) by mouth daily.  . IRON PO Take 2,000 mg by mouth.   . Iron-Vitamins (GERITOL COMPLETE PO) Take by mouth.  . calcium-vitamin D (OSCAL WITH D) 500-200 MG-UNIT tablet Take 1 tablet by mouth.  . cyclobenzaprine (FLEXERIL) 5 MG tablet Take 1 tablet (5 mg total) by mouth 3 (three) times daily as needed for muscle spasms. May take 1 or 2  . naproxen (NAPROSYN) 500 MG tablet Take 1 tablet (500 mg total) by mouth 2 (two) times daily with a meal. For neck pain  . polyethylene glycol powder (GLYCOLAX/MIRALAX)  powder Take 17 g by mouth daily. (Patient not taking: Reported on 06/29/2017)   No facility-administered encounter medications on file as of 06/29/2017.     No Known Allergies  Review of Systems  Constitutional: Positive for diaphoresis. Negative for activity change, appetite change and unexpected weight change.       Lost weight diet and exercise.  Hot flashes  HENT: Negative for congestion.   Eyes: Negative for photophobia.  Respiratory: Negative for cough and shortness of breath.   Cardiovascular: Negative for chest pain and leg swelling.  Gastrointestinal: Negative for constipation and diarrhea.  Genitourinary: Negative for pelvic pain, vaginal bleeding and vaginal discharge.  Musculoskeletal: Positive for neck pain and neck stiffness.  Neurological: Negative for dizziness and headaches.  Psychiatric/Behavioral: Positive for agitation, decreased concentration and sleep disturbance. Negative for hallucinations. The patient is not nervous/anxious.     BP 130/76 (BP Location: Right Arm, Patient Position: Sitting, Cuff Size: Normal)   Pulse 100   Temp 98 F (36.7 C) (Other (Comment))   Resp 18   Ht 5\' 2"  (1.575 m)   Wt 196 lb 4 oz (89 kg)   SpO2 99%   BMI 35.89 kg/m   Physical Exam  Constitutional: She appears well-developed and well-nourished. No distress.  HENT:  Head: Normocephalic and atraumatic.  Mouth/Throat: Oropharynx is clear and moist.  Neck: Normal range of  motion. Neck supple.  Tender right upper body trapezius into lower neck  Cardiovascular: Normal rate, regular rhythm and normal heart sounds.   Pulmonary/Chest: Effort normal and breath sounds normal.  Musculoskeletal: Normal range of motion. She exhibits no edema.  Neurological: She is alert. She displays normal reflexes.  Upper extremities with normal strength, sensation and reflexes  Psychiatric: Her affect is labile. She is agitated. She expresses impulsivity.  Animated, loud, mildly rapid speech, poor  judgement at times    ASSESSMENT/PLAN:  1. Tobacco abuse Trying to quit - successful intermittently - then picks back up  2. Bipolar 1 disorder ([place]) Not well controlled  3. Strain of neck muscle, initial encounter Discussed naproxen, flexeril, ice  4. Hot flashes due to menopause - Ambulatory referral to Gynecology   Patient Instructions  Take the naprosyn twice a day This is for neck pain Take it with food Take the muscle relaxer as needed Take one or two at bedtime Use ice or heat to neck  I have made a referral to gynecology to see about a patch for menopause hot flashes  Need  To see me in 3 months Blood work not due until february   Raylene Everts, MD

## 2017-06-29 NOTE — Patient Instructions (Addendum)
Take the naprosyn twice a day This is for neck pain Take it with food Take the muscle relaxer as needed Take one or two at bedtime Use ice or heat to neck  I have made a referral to gynecology to see about a patch for menopause hot flashes  Need  To see me in 3 months Blood work not due until february

## 2017-07-11 ENCOUNTER — Encounter: Payer: Self-pay | Admitting: Adult Health

## 2017-07-11 ENCOUNTER — Other Ambulatory Visit (HOSPITAL_COMMUNITY)
Admission: RE | Admit: 2017-07-11 | Discharge: 2017-07-11 | Disposition: A | Discharge status: Home / Self Care | Payer: Medicare Other | Source: Ambulatory Visit | Attending: Adult Health | Admitting: Adult Health

## 2017-07-11 ENCOUNTER — Ambulatory Visit (INDEPENDENT_AMBULATORY_CARE_PROVIDER_SITE_OTHER): Payer: Medicare Other | Admitting: Adult Health

## 2017-07-11 VITALS — BP 80/60 | HR 80 | Ht 62.0 in | Wt 204.0 lb

## 2017-07-11 DIAGNOSIS — Z90710 Acquired absence of both cervix and uterus: Secondary | ICD-10-CM | POA: Insufficient documentation

## 2017-07-11 DIAGNOSIS — Z08 Encounter for follow-up examination after completed treatment for malignant neoplasm: Secondary | ICD-10-CM | POA: Insufficient documentation

## 2017-07-11 DIAGNOSIS — Z8541 Personal history of malignant neoplasm of cervix uteri: Secondary | ICD-10-CM | POA: Diagnosis not present

## 2017-07-11 DIAGNOSIS — K439 Ventral hernia without obstruction or gangrene: Secondary | ICD-10-CM

## 2017-07-11 DIAGNOSIS — Z1212 Encounter for screening for malignant neoplasm of rectum: Secondary | ICD-10-CM | POA: Diagnosis not present

## 2017-07-11 DIAGNOSIS — K625 Hemorrhage of anus and rectum: Secondary | ICD-10-CM | POA: Insufficient documentation

## 2017-07-11 DIAGNOSIS — Z1211 Encounter for screening for malignant neoplasm of colon: Secondary | ICD-10-CM

## 2017-07-11 DIAGNOSIS — Z9223 Personal history of estrogen therapy: Secondary | ICD-10-CM | POA: Diagnosis not present

## 2017-07-11 DIAGNOSIS — R232 Flushing: Secondary | ICD-10-CM | POA: Diagnosis not present

## 2017-07-11 DIAGNOSIS — R61 Generalized hyperhidrosis: Secondary | ICD-10-CM

## 2017-07-11 HISTORY — DX: Acquired absence of both cervix and uterus: Z90.710

## 2017-07-11 HISTORY — DX: Encounter for follow-up examination after completed treatment for malignant neoplasm: Z08

## 2017-07-11 LAB — HEMOCCULT GUIAC POC 1CARD (OFFICE): FECAL OCCULT BLD: NEGATIVE

## 2017-07-11 MED ORDER — ESTRADIOL 1 MG PO TABS: 1.0000 mg | ORAL_TABLET | Freq: Every day | ORAL | 12 refills | Status: DC

## 2017-07-11 NOTE — Patient Instructions (Signed)
Pap in 1 year

## 2017-07-11 NOTE — Progress Notes (Signed)
Subjective:     Patient ID: Regina Carson, female   DOB: 1966-03-06, 51 y.o.   MRN: [id number]  HPI Regina Carson is a 51 year old black female, G1P1, sp hysterectomy at 25 1/2 for cervical cancer.She is here for pap smear. She was on ET for 20 years and stopped the premarin due to excess hair growth, now has hot flashes and night sweats, and does not sleep well.She is sexually active. PCP is Dr Meda Coffee.   Review of Systems +hot flashes +night sweats,does not sleep well  Stomach hurts at times, ?hernia +mood swings +sexually active She denies any problems with urination or BMs,but wipes blood at times  Reviewed past medical,surgical, social and family history. Reviewed medications and allergies.     Objective:   Physical Exam BP (!) 80/60 (BP Location: Left Arm, Patient Position: Sitting, Cuff Size: Small)   Pulse 80   Ht 5\' 2"  (1.575 m)   Wt 204 lb (92.5 kg)   BMI 37.31 kg/m    Skin warm and dry.Pelvic: external genitalia is normal in appearance no lesions, vagina: pink with good moisture and rugae,urethra has no lesions or masses noted, cervix and uterus are absent, pap performed with GC/CHL and HPV, adnexa: no masses or tenderness noted. Bladder is non tender and no masses felt. On rectal exam, has good tone, no masses and hemoccult is negative. Abdomen is soft and tender at vertical scar to the left ?hernia.  Will restart estrogen therapy,as she denies any stroke, MI, DVT or breast cancer. She missed colonoscopy and needs to reschedule.   Assessment:     1. Vaginal Pap smear following hysterectomy for malignancy   2. Hot flashes   3. Night sweats   4. Hernia of anterior abdominal wall   5. H/O estrogen therapy   6. Screening for colorectal cancer       Plan:     Rx estrace 1 mg #30 take 1 daily with 12 refills Pap with GC/CHL and HPV sent Pap in 1 year Follow up with Dr Meda Coffee about getting colonoscopy rescheduled and referral for hernia evaluation

## 2017-07-11 NOTE — Addendum Note (Signed)
Addended by: Diona Fanti A on: 07/11/2017 03:10 PM   Modules accepted: Orders

## 2017-07-12 ENCOUNTER — Telehealth: Payer: Self-pay | Admitting: *Deleted

## 2017-07-12 DIAGNOSIS — K219 Gastro-esophageal reflux disease without esophagitis: Secondary | ICD-10-CM

## 2017-07-12 NOTE — Telephone Encounter (Signed)
Patient called yesterday to schedule her gastro appointment I made her aware to call [facility] to schedule because that is where we went the referral to. I seen where referral was closed due to patient refusal, referral dates where still good 12/12/16-12/07/17 I authorized referral. Patient called this morning and left a message stating [facility] told her she will need a new referral, in referral scheduling she missed 3 appointments with them 02/14/17, 01/23/17, 01/04/17. Can we send a new referral?

## 2017-07-13 NOTE — Telephone Encounter (Signed)
I called to let patient know a new referral has been placed to [facility], no answer unable to leave message. Referral sent to [facility]

## 2017-07-14 LAB — CYTOLOGY - PAP
CHLAMYDIA, DNA PROBE: NEGATIVE
Diagnosis: NEGATIVE
HPV: NOT DETECTED
NEISSERIA GONORRHEA: NEGATIVE

## 2017-07-19 ENCOUNTER — Other Ambulatory Visit: Payer: Self-pay

## 2017-07-19 ENCOUNTER — Ambulatory Visit (INDEPENDENT_AMBULATORY_CARE_PROVIDER_SITE_OTHER): Payer: Medicare Other | Admitting: Gastroenterology

## 2017-07-19 ENCOUNTER — Encounter: Payer: Self-pay | Admitting: Gastroenterology

## 2017-07-19 DIAGNOSIS — K625 Hemorrhage of anus and rectum: Secondary | ICD-10-CM

## 2017-07-19 DIAGNOSIS — K5909 Other constipation: Secondary | ICD-10-CM

## 2017-07-19 DIAGNOSIS — Z86010 Personal history of colonic polyps: Secondary | ICD-10-CM

## 2017-07-19 NOTE — Progress Notes (Deleted)
   Subjective:    Patient ID: Regina Carson, female    DOB: [birth year], 51 y.o.   MRN: [id number]  HPI    Review of Systems     Objective:   Physical Exam        Assessment & Plan:

## 2017-07-19 NOTE — Progress Notes (Signed)
cc'ed to pcp °

## 2017-07-19 NOTE — Patient Instructions (Signed)
PA info for TCS/+/-hem banding submitted via [facility] website. No PA needed. Decision ID# [id number].

## 2017-07-19 NOTE — Progress Notes (Signed)
ON RECALL  °

## 2017-07-19 NOTE — Progress Notes (Addendum)
Subjective:    Patient ID: Regina Carson, female    DOB: 07/17/1966, 51 y.o.   MRN: [id number]  Regina Everts, MD  HPI Been having bleeding from rectum. WHEN SHE WIPES. ALWAYS RECTAL BLEEDING, PRESSURE, PAIN, ITCHING, BURNING YESTERDAY., SOILING. TAKES MIRALAX TO PREVENT CONSTIPATION. HAD CONSTIPATION ALL HER LIFE. WORKING ON WEIGHT LOSS. LOST 20 LBS ALREADY. GOING THROUGH THE CHANGE. HAS SEVERE NIGHT SWEATS DUE TO MENOPAUSE. HAD CHEST PAIN YESTERDAY FOR ABOUT 30 MINS. PUT PRESSURE ON CHEST TO EASE IT. WAS WORKING OUT AND LEFT SIDE SWELLS UP AND THEN SHE STOPPED BECAUSE SHE KEPT CRAMPING. HAD FOUR POLYPS REMOVED IN 2010 IN [place].   PT DENIES FEVER, CHILLS, nausea, vomiting, melena, diarrhea, SHORTNESS OF BREATH,  CHANGE IN BOWEL IN HABITS, problems swallowing, problems with sedation, OR heartburn or indigestion.  Past Medical History:  Diagnosis Date  . Anxiety   . Arthritis   . Bipolar 1 disorder ([place])   . Depression   . Fibrocystic breast disease   . History of cervical cancer   . Hyperlipidemia   . Schizoaffective disorder ([place])   . Tobacco abuse 12/12/2016  . Vaginal Pap smear, abnormal    Past Surgical History:  Procedure Laterality Date  . ABDOMINAL HYSTERECTOMY  1988   class 4 pap smear  . APPENDECTOMY  2015   No Known Allergies  Current Outpatient Prescriptions  Medication Sig Dispense Refill  . calcium-vitamin D (OSCAL WITH D) 500-200 MG-UNIT tablet Take 1 tablet by mouth.    . Cholecalciferol (D3 DOTS) 2000 units TBDP Take by mouth.    . cyclobenzaprine (FLEXERIL) 5 MG tablet Take 1 tablet (5 mg total) by mouth 3 (three) times daily as needed for muscle spasms. May take 1 or 2    . divalproex (DEPAKOTE ER) 500 MG 24 hr tablet Take 1 tablet (500 mg total) by mouth daily.    Marland Kitchen estradiol (ESTRACE) 1 MG tablet Take 1 tablet (1 mg total) by mouth daily.    . Iron-Vitamins (GERITOL COMPLETE PO) Take by mouth.    . naproxen (NAPROSYN) 500 MG tablet Take 1 tablet  (500 mg total) by mouth 2 (two) times daily with a meal. For neck pain    . polyethylene glycol powder (GLYCOLAX/MIRALAX) powder Take 17 g by mouth daily.    . IRON PO Take 2,000 mg by mouth.      Family History  Problem Relation Age of Onset  . Hyperlipidemia Mother   . Hypertension Mother   . Liver cancer Father   . Alcohol abuse Father   . Mental illness Sister   . Mental illness Brother   . Colon cancer Neg Hx   . Colon polyps Neg Hx     Social History   Social History  . Marital status: Single    Spouse name: N/A  . Number of children: 1  . Years of education: 35   Occupational History  . disabled     mental   Social History Main Topics  . Smoking status: Light Tobacco Smoker    Packs/day: 0.50    Types: Cigarettes  . Smokeless tobacco: Never Used     Comment: one pack a week  . Alcohol use Yes     Comment: social , 01-12-2017 per pt yes on the Weekends  . Drug use: Yes    Frequency: 1.0 time per week    Types: Marijuana     Comment: last used this past week as of 02/11/17  .  Sexual activity: Yes    Birth control/ protection: Surgical     Comment: hystorectomy   Other Topics Concern  . None   Social History Narrative   Disabled mental illness   Lives alone  [place](AGE 18, BORN [birth year])  Review of Systems PER HPI OTHERWISE ALL SYSTEMS ARE NEGATIVE.     Objective:   Physical Exam  Constitutional: She is oriented to person, place, and time. She appears well-developed and well-nourished. No distress.  HENT:  Head: Normocephalic and atraumatic.  Mouth/Throat: Oropharynx is clear and moist. No oropharyngeal exudate.  Eyes: Pupils are equal, round, and reactive to light. No scleral icterus.  POOR DENTITION  Neck: Normal range of motion. Neck supple.  Cardiovascular: Normal rate, regular rhythm and normal heart sounds.   Pulmonary/Chest: Effort normal and breath sounds normal. No respiratory distress.  Abdominal: Soft. Bowel sounds are normal. She  exhibits distension (mild). There is tenderness (mild IN LEFT PERIINCISIONAL REGION). There is no rebound and no guarding.  MIDLINE BULGE THAT INCREASES WITH VALSALVA, REDUCIBLE, MIDLINE INCISION WELL HEALED    Musculoskeletal: She exhibits no edema.  Lymphadenopathy:    She has no cervical adenopathy.  Neurological: She is alert and oriented to person, place, and time.  NO  NEW FOCAL DEFICITS  Psychiatric:  FLAT AFFECT, SLIGHTLY ANXIOUS MOOD  Vitals reviewed.     Assessment & Plan:

## 2017-07-19 NOTE — Assessment & Plan Note (Addendum)
PERSONAL HISTORY OF POLYPS AND NOW HAS RECTAL BLEEDING MOST LIKELY DUE TO HEMORHOIDS.  DRINK WATER TO KEEP YOUR URINE LIGHT YELLOW. FOLLOW A HIGH FIBER DIET. AVOID ITEMS THAT CAUSE BLOATING & GAS. SEE INFO BELOW. COMPLETE COLONOSCOPY WITH POSSIBLE HEMORRHOID BANDING IN 2-3 WEEKS. CLEAR LIQUIDS ON DAY BEFORE. HOLD IRON 7 DAYS.  DISCUSSED PROCEDURE, BENEFITS, & RISKS: < 1% chance of medication reaction, bleeding, perforation, or rupture of spleen/liver. USE MAC DUE TO POLYPHARMACY AND THC USE. TO AVOID CONSTIPATION-ADD LINZESS 30 MINS PRIOR TO BREAKFAST TO PREVENT CONSTIPATION. IT MAY CAUSE EXPLOSIVE DIARRHEA. HOLD FOR CONSTIPATION. DO NOT USE MIRALAX IF YOU ARE TAKING LINZESS.  FOLLOW UP IN 6 MOS.

## 2017-07-19 NOTE — Patient Instructions (Addendum)
DRINK WATER TO KEEP YOUR URINE LIGHT YELLOW.  FOLLOW A HIGH FIBER DIET. AVOID ITEMS THAT CAUSE BLOATING & GAS. SEE INFO BELOW.   COMPLETE COLONOSCOPY WITH POSSIBLE HEMORRHOID BANDING IN 2-3 WEEKS. Marland Kitchen  ADD LINZESS 30 MINS PRIOR TO BREAKFAST TO PREVENT CONSTIPATION. IT MAY CAUSE EXPLOSIVE DIARRHEA. HOLD FOR CONSTIPATION.  DO NOT USE MIRALAX IF YOU ARE TAKING LINZESS.  FOLLOW UP IN 6 MOS.    High-Fiber Diet A high-fiber diet changes your normal diet to include more whole grains, legumes, fruits, and vegetables. Changes in the diet involve replacing refined carbohydrates with unrefined foods. The calorie level of the diet is essentially unchanged. The Dietary Reference Intake (recommended amount) for adult males is 38 grams per day. For adult females, it is 25 grams per day. Pregnant and lactating women should consume 28 grams of fiber per day. Fiber is the intact part of a plant that is not broken down during digestion. Functional fiber is fiber that has been isolated from the plant to provide a beneficial effect in the body.  PURPOSE  Increase stool bulk.   Ease and regulate bowel movements.   Lower cholesterol.   REDUCE RISK OF COLON CANCER  INDICATIONS THAT YOU NEED MORE FIBER  Constipation and hemorrhoids.   Uncomplicated diverticulosis (intestine condition) and irritable bowel syndrome.   Weight management.   As a protective measure against hardening of the arteries (atherosclerosis), diabetes, and cancer.   GUIDELINES FOR INCREASING FIBER IN THE DIET  Start adding fiber to the diet slowly. A gradual increase of about 5 more grams (2 slices of whole-wheat bread, 2 servings of most fruits or vegetables, or 1 bowl of high-fiber cereal) per day is best. Too rapid an increase in fiber may result in constipation, flatulence, and bloating.   Drink enough water and fluids to keep your urine clear or pale yellow. Water, juice, or caffeine-free drinks are recommended. Not drinking  enough fluid may cause constipation.   Eat a variety of high-fiber foods rather than one type of fiber.   Try to increase your intake of fiber through using high-fiber foods rather than fiber pills or supplements that contain small amounts of fiber.   The goal is to change the types of food eaten. Do not supplement your present diet with high-fiber foods, but replace foods in your present diet.   INCLUDE A VARIETY OF FIBER SOURCES  Replace refined and processed grains with whole grains, canned fruits with fresh fruits, and incorporate other fiber sources. White rice, white breads, and most bakery goods contain little or no fiber.   Brown whole-grain rice, buckwheat oats, and many fruits and vegetables are all good sources of fiber. These include: broccoli, Brussels sprouts, cabbage, cauliflower, beets, sweet potatoes, white potatoes (skin on), carrots, tomatoes, eggplant, squash, berries, fresh fruits, and dried fruits.   Cereals appear to be the richest source of fiber. Cereal fiber is found in whole grains and bran. Bran is the fiber-rich outer coat of cereal grain, which is largely removed in refining. In whole-grain cereals, the bran remains. In breakfast cereals, the largest amount of fiber is found in those with "bran" in their names. The fiber content is sometimes indicated on the label.   You may need to include additional fruits and vegetables each day.   In baking, for 1 cup white flour, you may use the following substitutions:   1 cup whole-wheat flour minus 2 tablespoons.   1/2 cup white flour plus 1/2 cup whole-wheat flour.

## 2017-07-19 NOTE — Assessment & Plan Note (Signed)
SYMPTOMS NOT IDEALLY CONTROLLED.  DRINK WATER TO KEEP YOUR URINE LIGHT YELLOW. FOLLOW A HIGH FIBER DIET. AVOID ITEMS THAT CAUSE BLOATING & GAS.  HANDOUT GIVEN. COMPLETE COLONOSCOPY WITH POSSIBLE HEMORRHOID BANDING IN 2-3 WEEKS. Marland Kitchen ADD LINZESS 30 MINS PRIOR TO BREAKFAST TO PREVENT CONSTIPATION. IT MAY CAUSE EXPLOSIVE DIARRHEA. HOLD FOR CONSTIPATION. DO NOT USE MIRALAX IF YOU ARE TAKING LINZESS. FOLLOW UP IN 6 MOS.

## 2017-07-20 ENCOUNTER — Telehealth: Payer: Self-pay

## 2017-07-20 NOTE — Telephone Encounter (Signed)
Tried to call pt to inform of pre-op appt 08/03/17 at 12:45pm, no answer, unable to leave message d/t no voicemail has been set-up. Letter mailed.
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
## 2017-07-24 ENCOUNTER — Ambulatory Visit (INDEPENDENT_AMBULATORY_CARE_PROVIDER_SITE_OTHER): Payer: Medicare Other | Admitting: Psychiatry

## 2017-07-24 ENCOUNTER — Encounter (HOSPITAL_COMMUNITY): Payer: Self-pay | Admitting: Psychiatry

## 2017-07-24 VITALS — BP 97/62 | HR 84 | Ht 62.0 in | Wt 202.0 lb

## 2017-07-24 DIAGNOSIS — Z818 Family history of other mental and behavioral disorders: Secondary | ICD-10-CM | POA: Diagnosis not present

## 2017-07-24 DIAGNOSIS — F3162 Bipolar disorder, current episode mixed, moderate: Secondary | ICD-10-CM | POA: Insufficient documentation

## 2017-07-24 DIAGNOSIS — F1211 Cannabis abuse, in remission: Secondary | ICD-10-CM

## 2017-07-24 DIAGNOSIS — R450 Nervousness: Secondary | ICD-10-CM

## 2017-07-24 DIAGNOSIS — G4700 Insomnia, unspecified: Secondary | ICD-10-CM | POA: Diagnosis not present

## 2017-07-24 DIAGNOSIS — F419 Anxiety disorder, unspecified: Secondary | ICD-10-CM

## 2017-07-24 DIAGNOSIS — F17210 Nicotine dependence, cigarettes, uncomplicated: Secondary | ICD-10-CM

## 2017-07-24 DIAGNOSIS — Z811 Family history of alcohol abuse and dependence: Secondary | ICD-10-CM

## 2017-07-24 MED ORDER — DIVALPROEX SODIUM ER 250 MG PO TB24: 750.0000 mg | ORAL_TABLET | Freq: Every day | ORAL | 1 refills | Status: DC

## 2017-07-24 NOTE — Patient Instructions (Addendum)
1. Increase depakote 750 mg at night 2. Return to clinic in one month for 30 mins 3. Obtain record from [facility] 4. Obtain blood test after one week

## 2017-07-26 ENCOUNTER — Telehealth (HOSPITAL_COMMUNITY): Payer: Self-pay | Admitting: Psychiatry
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
## 2017-07-31 NOTE — Patient Instructions (Addendum)
Regina Carson  07/31/2017     @PREFPERIOPPHARMACY @   Your procedure is scheduled on 08/08/2017.  Report to [facility] at 12:15 A.M.  Call this number if you have problems the morning of surgery:  [phone]   Remember:  Do not eat food or drink liquids after midnight.  Take these medicines the morning of surgery with A SIP OF WATER Flexeril, Depakote and Linzess   Do not wear jewelry, make-up or nail polish.  Do not wear lotions, powders, or perfumes, or deoderant.  Do not shave 48 hours prior to surgery.  Men may shave face and neck.  Do not bring valuables to the hospital.  [facility] is not responsible for any belongings or valuables.  Contacts, dentures or bridgework may not be worn into surgery.  Leave your suitcase in the car.  After surgery it may be brought to your room.  For patients admitted to the hospital, discharge time will be determined by your treatment team.  Patients discharged the day of surgery will not be allowed to drive home.   Name and phone number of your driver:   family Special instructions:  [facility]/  Please read over the following fact sheets that you were given. Care and Recovery After Surgery       Hemorrhoids Hemorrhoids are swollen veins in and around the rectum or anus. There are two types of hemorrhoids:  Internal hemorrhoids. These occur in the veins that are just inside the rectum. They may poke through to the outside and become irritated and painful.  External hemorrhoids. These occur in the veins that are outside of the anus and can be felt as a painful swelling or hard lump near the anus.  Most hemorrhoids do not cause serious problems, and they can be managed with home treatments such as diet and lifestyle changes. If home treatments do not help your symptoms, procedures can be done to shrink or remove the hemorrhoids. What are the causes? This condition is caused by increased pressure in the anal area. This pressure may  result from various things, including:  Constipation.  Straining to have a bowel movement.  Diarrhea.  Pregnancy.  Obesity.  Sitting for long periods of time.  Heavy lifting or other activity that causes you to strain.  Anal sex.  What are the signs or symptoms? Symptoms of this condition include:  Pain.  Anal itching or irritation.  Rectal bleeding.  Leakage of stool (feces).  Anal swelling.  One or more lumps around the anus.  How is this diagnosed? This condition can often be diagnosed through a visual exam. Other exams or tests may also be done, such as:  Examination of the rectal area with a gloved hand (digital rectal exam).  Examination of the anal canal using a small tube (anoscope).  A blood test, if you have lost a significant amount of blood.  A test to look inside the colon (sigmoidoscopy or colonoscopy).  How is this treated? This condition can usually be treated at home. However, various procedures may be done if dietary changes, lifestyle changes, and other home treatments do not help your symptoms. These procedures can help make the hemorrhoids smaller or remove them completely. Some of these procedures involve surgery, and others do not. Common procedures include:  Rubber band ligation. Rubber bands are placed at the base of the hemorrhoids to cut off the blood supply to them.  Sclerotherapy. Medicine is injected into the hemorrhoids to shrink them.  Infrared  coagulation. A type of light energy is used to get rid of the hemorrhoids.  Hemorrhoidectomy surgery. The hemorrhoids are surgically removed, and the veins that supply them are tied off.  Stapled hemorrhoidopexy surgery. A circular stapling device is used to remove the hemorrhoids and use staples to cut off the blood supply to them.  Follow these instructions at home: Eating and drinking  Eat foods that have a lot of fiber in them, such as whole grains, beans, nuts, fruits, and  vegetables. Ask your health care provider about taking products that have added fiber (fiber supplements).  Drink enough fluid to keep your urine clear or pale yellow. Managing pain and swelling  Take warm sitz baths for 20 minutes, 3-4 times a day to ease pain and discomfort.  If directed, apply ice to the affected area. Using ice packs between sitz baths may be helpful. ? Put ice in a plastic bag. ? Place a towel between your skin and the bag. ? Leave the ice on for 20 minutes, 2-3 times a day. General instructions  Take over-the-counter and prescription medicines only as told by your health care provider.  Use medicated creams or suppositories as told.  Exercise regularly.  Go to the bathroom when you have the urge to have a bowel movement. Do not wait.  Avoid straining to have bowel movements.  Keep the anal area dry and clean. Use wet toilet paper or moist towelettes after a bowel movement.  Do not sit on the toilet for long periods of time. This increases blood pooling and pain. Contact a health care provider if:  You have increasing pain and swelling that are not controlled by treatment or medicine.  You have uncontrolled bleeding.  You have difficulty having a bowel movement, or you are unable to have a bowel movement.  You have pain or inflammation outside the area of the hemorrhoids. This information is not intended to replace advice given to you by your health care provider. Make sure you discuss any questions you have with your health care provider. Document Released: 10/28/2000 Document Revised: 03/30/2016 Document Reviewed: 07/15/2015 Elsevier Interactive Patient Education  2017 [place].  Colonoscopy, Adult A colonoscopy is an exam to look at the entire large intestine. During the exam, a lubricated, bendable tube is inserted into the anus and then passed into the rectum, colon, and other parts of the large intestine. A colonoscopy is often done as a part  of normal colorectal screening or in response to certain symptoms, such as anemia, persistent diarrhea, abdominal pain, and blood in the stool. The exam can help screen for and diagnose medical problems, including:  Tumors.  Polyps.  Inflammation.  Areas of bleeding.  Tell a health care provider about:  Any allergies you have.  All medicines you are taking, including vitamins, herbs, eye drops, creams, and over-the-counter medicines.  Any problems you or family members have had with anesthetic medicines.  Any blood disorders you have.  Any surgeries you have had.  Any medical conditions you have.  Any problems you have had passing stool. What are the risks? Generally, this is a safe procedure. However, problems may occur, including:  Bleeding.  A tear in the intestine.  A reaction to medicines given during the exam.  Infection (rare).  What happens before the procedure? Eating and drinking restrictions Follow instructions from your health care provider about eating and drinking, which may include:  A few days before the procedure - follow a low-fiber diet. Avoid  nuts, seeds, dried fruit, raw fruits, and vegetables.  1-3 days before the procedure - follow a clear liquid diet. Drink only clear liquids, such as clear broth or bouillon, black coffee or tea, clear juice, clear soft drinks or sports drinks, gelatin dessert, and popsicles. Avoid any liquids that contain red or purple dye.  On the day of the procedure - do not eat or drink anything during the 2 hours before the procedure, or within the time period that your health care provider recommends.  Bowel prep If you were prescribed an oral bowel prep to clean out your colon:  Take it as told by your health care provider. Starting the day before your procedure, you will need to drink a large amount of medicated liquid. The liquid will cause you to have multiple loose stools until your stool is almost clear or light  green.  If your skin or anus gets irritated from diarrhea, you may use these to relieve the irritation: ? Medicated wipes, such as adult wet wipes with aloe and vitamin E. ? A skin soothing-product like petroleum jelly.  If you vomit while drinking the bowel prep, take a break for up to 60 minutes and then begin the bowel prep again. If vomiting continues and you cannot take the bowel prep without vomiting, call your health care provider.  General instructions  Ask your health care provider about changing or stopping your regular medicines. This is especially important if you are taking diabetes medicines or blood thinners.  Plan to have someone take you home from the hospital or clinic. What happens during the procedure?  An IV tube may be inserted into one of your veins.  You will be given medicine to help you relax (sedative).  To reduce your risk of infection: ? Your health care team will wash or sanitize their hands. ? Your anal area will be washed with soap.  You will be asked to lie on your side with your knees bent.  Your health care provider will lubricate a long, thin, flexible tube. The tube will have a camera and a light on the end.  The tube will be inserted into your anus.  The tube will be gently eased through your rectum and colon.  Air will be delivered into your colon to keep it open. You may feel some pressure or cramping.  The camera will be used to take images during the procedure.  A small tissue sample may be removed from your body to be examined under a microscope (biopsy). If any potential problems are found, the tissue will be sent to a lab for testing.  If small polyps are found, your health care provider may remove them and have them checked for cancer cells.  The tube that was inserted into your anus will be slowly removed. The procedure may vary among health care providers and hospitals. What happens after the procedure?  Your blood pressure,  heart rate, breathing rate, and blood oxygen level will be monitored until the medicines you were given have worn off.  Do not drive for 24 hours after the exam.  You may have a small amount of blood in your stool.  You may pass gas and have mild abdominal cramping or bloating due to the air that was used to inflate your colon during the exam.  It is up to you to get the results of your procedure. Ask your health care provider, or the department performing the procedure, when your results will be  ready. This information is not intended to replace advice given to you by your health care provider. Make sure you discuss any questions you have with your health care provider. Document Released: 10/28/2000 Document Revised: 08/31/2016 Document Reviewed: 01/12/2016 Elsevier Interactive Patient Education  2018 [employer].

## 2017-08-02 DIAGNOSIS — Z029 Encounter for administrative examinations, unspecified: Secondary | ICD-10-CM

## 2017-08-03 ENCOUNTER — Encounter (HOSPITAL_COMMUNITY)
Admission: RE | Admit: 2017-08-03 | Discharge: 2017-08-03 | Disposition: A | Discharge status: Home / Self Care | Payer: Medicare Other | Source: Ambulatory Visit | Attending: Gastroenterology | Admitting: Gastroenterology

## 2017-08-03 ENCOUNTER — Encounter (HOSPITAL_COMMUNITY): Payer: Self-pay

## 2017-08-03 ENCOUNTER — Telehealth: Payer: Self-pay

## 2017-08-03 DIAGNOSIS — Z86010 Personal history of colonic polyps: Secondary | ICD-10-CM | POA: Insufficient documentation

## 2017-08-03 DIAGNOSIS — Z01812 Encounter for preprocedural laboratory examination: Secondary | ICD-10-CM | POA: Diagnosis not present

## 2017-08-03 DIAGNOSIS — K625 Hemorrhage of anus and rectum: Secondary | ICD-10-CM | POA: Diagnosis not present

## 2017-08-03 HISTORY — DX: Anemia, unspecified: D64.9

## 2017-08-03 HISTORY — DX: Personal history of urinary calculi: Z87.442

## 2017-08-03 LAB — BASIC METABOLIC PANEL
Anion gap: 11 (ref 5–15)
BUN: 13 mg/dL (ref 6–20)
CHLORIDE: 104 mmol/L (ref 101–111)
CO2: 23 mmol/L (ref 22–32)
CREATININE: 0.95 mg/dL (ref 0.44–1.00)
Calcium: 9 mg/dL (ref 8.9–10.3)
GFR calc non Af Amer: >60 mL/min (ref >60)
Glucose, Bld: 98 mg/dL (ref 65–99)
Potassium: 3.8 mmol/L (ref 3.5–5.1)
Sodium: 138 mmol/L (ref 135–145)

## 2017-08-03 LAB — CBC
HEMATOCRIT: 42.1 % (ref 36.0–46.0)
HEMOGLOBIN: 14.5 g/dL (ref 12.0–15.0)
MCH: 32.2 pg (ref 26.0–34.0)
MCHC: 34.4 g/dL (ref 30.0–36.0)
MCV: 93.3 fL (ref 78.0–100.0)
Platelets: 248 10*3/uL (ref 150–400)
RBC: 4.51 MIL/uL (ref 3.87–5.11)
RDW: 13.6 % (ref 11.5–15.5)
WBC: 9.3 10*3/uL (ref 4.0–10.5)

## 2017-08-03 NOTE — Telephone Encounter (Signed)
Pt called back and she will move up her time on Tuesday to 12:45 pm. She is aware of the new time to drink her prep.

## 2017-08-03 NOTE — Telephone Encounter (Signed)
Tried to call with no answer  

## 2017-08-04 ENCOUNTER — Encounter (HOSPITAL_COMMUNITY): Payer: Self-pay | Admitting: Psychiatry

## 2017-08-04 LAB — COMPREHENSIVE METABOLIC PANEL
AG Ratio: 1.4 (calc) (ref 1.0–2.5)
ALT: 14 U/L (ref 6–29)
AST: 16 U/L (ref 10–35)
Albumin: 3.9 g/dL (ref 3.6–5.1)
Alkaline phosphatase (APISO): 44 U/L (ref 33–130)
BUN / CREAT RATIO: 12 (calc) (ref 6–22)
BUN: 13 mg/dL (ref 7–25)
CO2: 27 mmol/L (ref 20–32)
CREATININE: 1.07 mg/dL — AB (ref 0.50–1.05)
Calcium: 9.1 mg/dL (ref 8.6–10.4)
Chloride: 105 mmol/L (ref 98–110)
Globulin: 2.7 g/dL (calc) (ref 1.9–3.7)
Glucose, Bld: 102 mg/dL (ref 65–139)
Potassium: 4.1 mmol/L (ref 3.5–5.3)
SODIUM: 140 mmol/L (ref 135–146)
Total Bilirubin: 0.6 mg/dL (ref 0.2–1.2)
Total Protein: 6.6 g/dL (ref 6.1–8.1)

## 2017-08-04 LAB — VALPROIC ACID LEVEL: Valproic Acid Lvl: 68.5 mg/L (ref 50.0–100.0)

## 2017-08-08 ENCOUNTER — Ambulatory Visit (HOSPITAL_COMMUNITY)
Admission: RE | Admit: 2017-08-08 | Discharge: 2017-08-08 | Disposition: A | Discharge status: Home / Self Care | Payer: Medicare Other | Source: Ambulatory Visit | Attending: Gastroenterology | Admitting: Gastroenterology

## 2017-08-08 ENCOUNTER — Ambulatory Visit (HOSPITAL_COMMUNITY): Payer: Medicare Other | Admitting: Anesthesiology

## 2017-08-08 ENCOUNTER — Encounter (HOSPITAL_COMMUNITY): Payer: Self-pay | Admitting: *Deleted

## 2017-08-08 ENCOUNTER — Encounter (HOSPITAL_COMMUNITY)
Admission: RE | Disposition: A | Discharge status: Home / Self Care | Payer: Self-pay | Source: Ambulatory Visit | Attending: Gastroenterology

## 2017-08-08 DIAGNOSIS — Z79890 Hormone replacement therapy (postmenopausal): Secondary | ICD-10-CM | POA: Diagnosis not present

## 2017-08-08 DIAGNOSIS — K639 Disease of intestine, unspecified: Secondary | ICD-10-CM | POA: Diagnosis not present

## 2017-08-08 DIAGNOSIS — F17210 Nicotine dependence, cigarettes, uncomplicated: Secondary | ICD-10-CM | POA: Insufficient documentation

## 2017-08-08 DIAGNOSIS — Z791 Long term (current) use of non-steroidal anti-inflammatories (NSAID): Secondary | ICD-10-CM | POA: Diagnosis not present

## 2017-08-08 DIAGNOSIS — Z860100 Personal history of colon polyps, unspecified: Secondary | ICD-10-CM

## 2017-08-08 DIAGNOSIS — D649 Anemia, unspecified: Secondary | ICD-10-CM | POA: Diagnosis not present

## 2017-08-08 DIAGNOSIS — K649 Unspecified hemorrhoids: Secondary | ICD-10-CM | POA: Diagnosis not present

## 2017-08-08 DIAGNOSIS — F319 Bipolar disorder, unspecified: Secondary | ICD-10-CM | POA: Diagnosis not present

## 2017-08-08 DIAGNOSIS — K921 Melena: Secondary | ICD-10-CM

## 2017-08-08 DIAGNOSIS — K625 Hemorrhage of anus and rectum: Secondary | ICD-10-CM | POA: Diagnosis present

## 2017-08-08 DIAGNOSIS — K635 Polyp of colon: Secondary | ICD-10-CM | POA: Diagnosis not present

## 2017-08-08 DIAGNOSIS — K6389 Other specified diseases of intestine: Secondary | ICD-10-CM | POA: Insufficient documentation

## 2017-08-08 DIAGNOSIS — Z79899 Other long term (current) drug therapy: Secondary | ICD-10-CM | POA: Diagnosis not present

## 2017-08-08 DIAGNOSIS — K648 Other hemorrhoids: Secondary | ICD-10-CM | POA: Diagnosis not present

## 2017-08-08 HISTORY — PX: POLYPECTOMY: SHX5525

## 2017-08-08 HISTORY — PX: HEMORRHOID BANDING: SHX5850

## 2017-08-08 HISTORY — PX: COLONOSCOPY WITH PROPOFOL: SHX5780

## 2017-08-08 SURGERY — COLONOSCOPY WITH PROPOFOL
Anesthesia: Monitor Anesthesia Care

## 2017-08-08 MED ORDER — CHLORHEXIDINE GLUCONATE CLOTH 2 % EX PADS: 6.0000 | MEDICATED_PAD | Freq: Once | CUTANEOUS | Status: DC

## 2017-08-08 MED ORDER — FENTANYL CITRATE (PF) 100 MCG/2ML IJ SOLN
INTRAMUSCULAR | Status: AC
  Filled 2017-08-08: qty 2

## 2017-08-08 MED ORDER — FENTANYL CITRATE (PF) 100 MCG/2ML IJ SOLN
25.0000 ug | Freq: Once | INTRAMUSCULAR | Status: AC
  Administered 2017-08-08: 25 ug via INTRAVENOUS

## 2017-08-08 MED ORDER — PROPOFOL 500 MG/50ML IV EMUL
INTRAVENOUS | Status: DC | PRN
  Administered 2017-08-08: 125 ug/kg/min via INTRAVENOUS

## 2017-08-08 MED ORDER — LINACLOTIDE 145 MCG PO CAPS: ORAL_CAPSULE | ORAL | 11 refills | Status: DC

## 2017-08-08 MED ORDER — PROPOFOL 10 MG/ML IV BOLUS
INTRAVENOUS | Status: DC | PRN
  Administered 2017-08-08 (×3): 15 mg via INTRAVENOUS

## 2017-08-08 MED ORDER — MIDAZOLAM HCL 2 MG/2ML IJ SOLN
INTRAMUSCULAR | Status: AC
  Filled 2017-08-08: qty 2

## 2017-08-08 MED ORDER — LACTATED RINGERS IV SOLN
INTRAVENOUS | Status: DC
  Administered 2017-08-08: 12:00:00 via INTRAVENOUS

## 2017-08-08 MED ORDER — MIDAZOLAM HCL 2 MG/2ML IJ SOLN
1.0000 mg | INTRAMUSCULAR | Status: AC
  Administered 2017-08-08 (×2): 2 mg via INTRAVENOUS
  Filled 2017-08-08: qty 2

## 2017-08-08 NOTE — Anesthesia Preprocedure Evaluation (Signed)
Anesthesia Evaluation  Patient identified by MRN, date of birth, ID band Patient awake    Reviewed: Allergy & Precautions, NPO status , Patient's Chart, lab work & pertinent test results  Airway Mallampati: II  TM Distance: >3 FB     Dental  (+) Teeth Intact, Missing,    Pulmonary Current Smoker,    breath sounds clear to auscultation       Cardiovascular negative cardio ROS   Rhythm:Regular Rate:Normal     Neuro/Psych PSYCHIATRIC DISORDERS Anxiety Depression Bipolar Disorder Schizoaffective disorder    GI/Hepatic negative GI ROS, Neg liver ROS, (+)     substance abuse  marijuana use,   Endo/Other  negative endocrine ROS  Renal/GU negative Renal ROS     Musculoskeletal  (+) Arthritis ,   Abdominal   Peds  Hematology  (+) anemia ,   Anesthesia Other Findings   Reproductive/Obstetrics                             Anesthesia Physical Anesthesia Plan  ASA: II  Anesthesia Plan: MAC   Post-op Pain Management:    Induction: Intravenous  PONV Risk Score and Plan:   Airway Management Planned: Simple Face Mask  Additional Equipment:   Intra-op Plan:   Post-operative Plan:   Informed Consent: I have reviewed the patients History and Physical, chart, labs and discussed the procedure including the risks, benefits and alternatives for the proposed anesthesia with the patient or authorized representative who has indicated his/her understanding and acceptance.     Plan Discussed with:   Anesthesia Plan Comments:         Anesthesia Quick Evaluation

## 2017-08-08 NOTE — Interval H&P Note (Signed)
History and Physical Interval Note:  08/08/2017 12:49 PM  Regina Carson  has presented today for surgery, with the diagnosis of personal history polyps, rectal bleeding  The various methods of treatment have been discussed with the patient and family. After consideration of risks, benefits and other options for treatment, the patient has consented to  Procedure(s) with comments: COLONOSCOPY WITH PROPOFOL (N/A) - 1:45pm HEMORRHOID BANDING (N/A) as a surgical intervention .  The patient's history has been reviewed, patient examined, no change in status, stable for surgery.  I have reviewed the patient's chart and labs.  Questions were answered to the patient's satisfaction.     [employer]

## 2017-08-08 NOTE — Discharge Instructions (Signed)
You have MODERATE SIZE internal hemorrhoids. YOU had THREE POLYPS removed. I PLACED 3 BAND TO TREAT YOUR HEMORRHOIDAL BLEEDING. YOU MAY SOME MILD BLEEDING OVER THE NEXT 3 TO 5 DAYS.   PLEASE CALL [phone] IF YOU HAVE A FEVER, A LARGE AMOUNT OF BLEEDING, OR DIFFICULTY URINATING.  DRINK WATER TO KEEP URINE LIGHT YELLOW.  YOU MAY USE NAPROXEN OR IBUPROFEN TWICE DAILY FOR RECTAL DISCOMFORT. TYLENOL AS NEED FOR ADDITIONAL PAIN RELIEF.  ADD LINZESS 30 MINS PRIOR TO BREAKFAST TO PREVENT CONSTIPATION. IT MAY CAUSE EXPLOSIVE DIARRHEA. HOLD FOR CONSTIPATION.  IF NEEDED, USE MIRALAX OR COLACE TWICE DAILY TO SOFTEN STOOL. DO NOT USE MIRALAX IF YOU ARE TAKING LINZESS.  YOUR BIOPSY RESULTS WILL BE AVAILABLE IN MY CHART AFTER SEP 2018 AND MY OFFICE WILL CONTACT YOU IN 10-14 DAYS WITH YOUR RESULTS.     DRINK WATER TO KEEP YOUR URINE LIGHT YELLOW.  ADVANCE YOUR DIET AS TOLERATED. AVOID CONSTIPATION. FOLLOW A HIGH FIBER DIET. AVOID ITEMS THAT CAUSE BLOATING & GAS.    FOLLOW UP IN 6 MOS.   Next colonoscopy in 5-10 years.    Colonoscopy Care After Read the instructions outlined below and refer to this sheet in the next week. These discharge instructions provide you with general information on caring for yourself after you leave the hospital. While your treatment has been planned according to the most current medical practices available, unavoidable complications occasionally occur. If you have any problems or questions after discharge, call DR. Cynthis Purington, [phone].  ACTIVITY  You may resume your regular activity, but move at a slower pace for the next 24 hours.   Take frequent rest periods for the next 24 hours.   Walking will help get rid of the air and reduce the bloated feeling in your belly (abdomen).   No driving for 24 hours (because of the medicine (anesthesia) used during the test).   You may shower.   Do not sign any important legal documents or operate any machinery for 24  hours (because of the anesthesia used during the test).    NUTRITION  Drink plenty of fluids.   You may resume your normal diet as instructed by your doctor.   Begin with a light meal and progress to your normal diet. Heavy or fried foods are harder to digest and may make you feel sick to your stomach (nauseated).   Avoid alcoholic beverages for 24 hours or as instructed.    MEDICATIONS  You may resume your normal medications.   WHAT YOU CAN EXPECT TODAY  Some feelings of bloating in the abdomen.   Passage of more gas than usual.   Spotting of blood in your stool or on the toilet paper  .  IF YOU HAD POLYPS REMOVED DURING THE COLONOSCOPY:  Eat a soft diet IF YOU HAVE NAUSEA, BLOATING, ABDOMINAL PAIN, OR VOMITING.    FINDING OUT THE RESULTS OF YOUR TEST Not all test results are available during your visit. DR. Oneida Alar WILL CALL YOU WITHIN 14 DAYS OF YOUR PROCEDUE WITH YOUR RESULTS. Do not assume everything is normal if you have not heard from DR. Murat Rideout, CALL HER OFFICE AT [phone].  SEEK IMMEDIATE MEDICAL ATTENTION AND CALL THE OFFICE: [phone] IF:  You have more than a spotting of blood in your stool.   Your belly is swollen (abdominal distention).   You are nauseated or vomiting.   You have a temperature over 101F.   You have abdominal pain or discomfort that is severe or gets  worse throughout the day.   HEMORRHOIDAL BANDING COMPLICATIONS:  COMMON: 1. MINOR PAIN  UNCOMMON: 1. ABSCESS 2. BAND FALLS OFF 3. PROLAPSE OF HEMORRHOIDS AND PAIN 4. ULCER BLEEDING  A. USUALLY SELF-LIMITED: MAY LAST 3-5 DAYS  B. MAY REQUIRE INTERVENTION: 1-2 WEEKS AFTER INTERACTIONS 5. NECROTIZING PELVIC SEPSIS  A. SYMPTOMS: FEVER, PAIN, DIFFICULTY URINATING   Hemorrhoids Hemorrhoids are dilated (enlarged) veins around the rectum. Sometimes clots will form in the veins. This makes them swollen and painful. These are called thrombosed hemorrhoids. Causes of hemorrhoids  include:  Constipation.   Straining to have a bowel movement.   HEAVY LIFTING  HOME CARE INSTRUCTIONS  Eat a well balanced diet and drink 6 to 8 glasses of water every day to avoid constipation. You may also use a bulk laxative.   Avoid straining to have bowel movements.   Keep anal area dry and clean.   Do not use a donut shaped pillow or sit on the toilet for long periods. This increases blood pooling and pain.   Move your bowels when your body has the urge; this will require less straining and will decrease pain and pressure.

## 2017-08-08 NOTE — H&P (View-Only) (Signed)
Subjective:    Patient ID: Regina Carson, female    DOB: August 24, 1966, 51 y.o.   MRN: [id number]  Raylene Everts, MD  HPI Been having bleeding from rectum. WHEN SHE WIPES. ALWAYS RECTAL BLEEDING, PRESSURE, PAIN, ITCHING, BURNING YESTERDAY., SOILING. TAKES MIRALAX TO PREVENT CONSTIPATION. HAD CONSTIPATION ALL HER LIFE. WORKING ON WEIGHT LOSS. LOST 20 LBS ALREADY. GOING THROUGH THE CHANGE. HAS SEVERE NIGHT SWEATS DUE TO MENOPAUSE. HAD CHEST PAIN YESTERDAY FOR ABOUT 30 MINS. PUT PRESSURE ON CHEST TO EASE IT. WAS WORKING OUT AND LEFT SIDE SWELLS UP AND THEN SHE STOPPED BECAUSE SHE KEPT CRAMPING. HAD FOUR POLYPS REMOVED IN 2010 IN [place].   PT DENIES FEVER, CHILLS, HEMATOCHEZIA, nausea, vomiting, melena, diarrhea, SHORTNESS OF BREATH,  CHANGE IN BOWEL IN HABITS, problems swallowing, problems with sedation, OR heartburn or indigestion.  Past Medical History:  Diagnosis Date  . Anxiety   . Arthritis   . Bipolar 1 disorder ([place])   . Depression   . Fibrocystic breast disease   . History of cervical cancer   . Hyperlipidemia   . Schizoaffective disorder ([place])   . Tobacco abuse 12/12/2016  . Vaginal Pap smear, abnormal    Past Surgical History:  Procedure Laterality Date  . ABDOMINAL HYSTERECTOMY  1988   class 4 pap smear  . APPENDECTOMY  2015   No Known Allergies  Current Outpatient Prescriptions  Medication Sig Dispense Refill  . calcium-vitamin D (OSCAL WITH D) 500-200 MG-UNIT tablet Take 1 tablet by mouth.    . Cholecalciferol (D3 DOTS) 2000 units TBDP Take by mouth.    . cyclobenzaprine (FLEXERIL) 5 MG tablet Take 1 tablet (5 mg total) by mouth 3 (three) times daily as needed for muscle spasms. May take 1 or 2    . divalproex (DEPAKOTE ER) 500 MG 24 hr tablet Take 1 tablet (500 mg total) by mouth daily.    Marland Kitchen estradiol (ESTRACE) 1 MG tablet Take 1 tablet (1 mg total) by mouth daily.    . Iron-Vitamins (GERITOL COMPLETE PO) Take by mouth.    . naproxen (NAPROSYN) 500 MG tablet  Take 1 tablet (500 mg total) by mouth 2 (two) times daily with a meal. For neck pain    . polyethylene glycol powder (GLYCOLAX/MIRALAX) powder Take 17 g by mouth daily.    . IRON PO Take 2,000 mg by mouth.      Family History  Problem Relation Age of Onset  . Hyperlipidemia Mother   . Hypertension Mother   . Liver cancer Father   . Alcohol abuse Father   . Mental illness Sister   . Mental illness Brother   . Colon cancer Neg Hx   . Colon polyps Neg Hx     Social History   Social History  . Marital status: Single    Spouse name: N/A  . Number of children: 1  . Years of education: 19   Occupational History  . disabled     mental   Social History Main Topics  . Smoking status: Light Tobacco Smoker    Packs/day: 0.50    Types: Cigarettes  . Smokeless tobacco: Never Used     Comment: one pack a week  . Alcohol use Yes     Comment: social , 01-12-2017 per pt yes on the Weekends  . Drug use: Yes    Frequency: 1.0 time per week    Types: Marijuana     Comment: last used this past week as of 02/11/17  .  Sexual activity: Yes    Birth control/ protection: Surgical     Comment: hystorectomy   Other Topics Concern  . None   Social History Narrative   Disabled mental illness   Lives alone  [place] [facility] 33, BORN [birth year])  Review of Systems PER HPI OTHERWISE ALL SYSTEMS ARE NEGATIVE.     Objective:   Physical Exam  Constitutional: She is oriented to person, place, and time. She appears well-developed and well-nourished. No distress.  HENT:  Head: Normocephalic and atraumatic.  Mouth/Throat: Oropharynx is clear and moist. No oropharyngeal exudate.  Eyes: Pupils are equal, round, and reactive to light. No scleral icterus.  POOR DENTITION  Neck: Normal range of motion. Neck supple.  Cardiovascular: Normal rate, regular rhythm and normal heart sounds.   Pulmonary/Chest: Effort normal and breath sounds normal. No respiratory distress.  Abdominal: Soft. Bowel sounds  are normal. She exhibits distension (mild). There is tenderness (mild IN LEFT PERIINCISIONAL REGION). There is no rebound and no guarding.  MIDLINE BULGE THAT INCREASES WITH VALSALVA, REDUCIBLE, MIDLINE INCISION WELL HEALED    Musculoskeletal: She exhibits no edema.  Lymphadenopathy:    She has no cervical adenopathy.  Neurological: She is alert and oriented to person, place, and time.  NO  NEW FOCAL DEFICITS  Psychiatric:  FLAT AFFECT, SLIGHTLY ANXIOUS MOOD  Vitals reviewed.     Assessment & Plan:

## 2017-08-08 NOTE — Anesthesia Postprocedure Evaluation (Signed)
Anesthesia Post Note  Patient: Regina Carson  Procedure(s) Performed: Procedure(s) (LRB): COLONOSCOPY WITH PROPOFOL (N/A) HEMORRHOID BANDING (N/A) POLYPECTOMY  Patient location during evaluation: PACU Anesthesia Type: MAC Level of consciousness: awake and alert and oriented Pain management: pain level controlled Vital Signs Assessment: post-procedure vital signs reviewed and stable Respiratory status: spontaneous breathing Cardiovascular status: blood pressure returned to baseline Postop Assessment: no apparent nausea or vomiting Anesthetic complications: no     Last Vitals:  Vitals:   08/08/17 1404 08/08/17 1421  BP: 102/74 (!) 138/93  Pulse:  71  Resp:  18  Temp:  36.7 C  SpO2: 100% 95%    Last Pain:  Vitals:   08/08/17 1421  TempSrc: Oral  PainSc:                  Tressie Stalker

## 2017-08-08 NOTE — Transfer of Care (Signed)
Immediate Anesthesia Transfer of Care Note  Patient: Regina Carson  Procedure(s) Performed: Procedure(s) with comments: COLONOSCOPY WITH PROPOFOL (N/A) - 1:45pm HEMORRHOID BANDING (N/A) POLYPECTOMY - colon  Patient Location: PACU  Anesthesia Type:MAC  Level of Consciousness: awake and alert   Airway & Oxygen Therapy: Patient Spontanous Breathing  Post-op Assessment: Report given to RN  Post vital signs: Reviewed and stable  Last Vitals:  Vitals:   08/08/17 1250 08/08/17 1255  BP: 107/76 118/84  Resp: 15 (!) 24  Temp:    SpO2: 100% 100%    Last Pain:  Vitals:   08/08/17 1112  TempSrc: Oral      Patients Stated Pain Goal: 10 (30/94/07 6808)  Complications: No apparent anesthesia complications

## 2017-08-08 NOTE — Op Note (Signed)
[facility] Patient Name: Regina Carson Procedure Date: 08/08/2017 12:32 PM MRN: [id number] Date of Birth: 01-13-1966 Attending MD: Barney Drain MD, MD CSN: [id number] Age: 51 Admit Type: Outpatient Procedure:                Colonoscopy with COLD FORCEPS/SNARE POLYPECTOMY &                            BANDING x3 Indications:              Hematochezia Providers:                Barney Drain MD, MD, Otis Peak B. Sharon Seller, RN, Aram Candela Referring MD:             Lysle Morales Medicines:                Propofol per Anesthesia Complications:            No immediate complications. Estimated Blood Loss:     Estimated blood loss was minimal. Procedure:                Pre-Anesthesia Assessment:                           - Prior to the procedure, a History and Physical                            was performed, and patient medications and                            allergies were reviewed. The patient's tolerance of                            previous anesthesia was also reviewed. The risks                            and benefits of the procedure and the sedation                            options and risks were discussed with the patient.                            All questions were answered, and informed consent                            was obtained. Prior Anticoagulants: The patient has                            taken no previous anticoagulant or antiplatelet                            agents. ASA Grade Assessment: II - A patient with  mild systemic disease. After reviewing the risks                            and benefits, the patient was deemed in                            satisfactory condition to undergo the procedure.                            After obtaining informed consent, the colonoscope                            was passed under direct vision. Throughout the                            procedure, the patient's blood  pressure, pulse, and                            oxygen saturations were monitored continuously. The                            EC-3890Li ([id number]) scope was introduced through                            the anus and advanced to the the cecum, identified                            by appendiceal orifice and ileocecal valve. The                            EC-3890Li ([id number]) scope was introduced through                            the and advanced to the. The colonoscopy was                            somewhat difficult due to a tortuous colon.                            Successful completion of the procedure was aided by                            COLOWRAP. The patient tolerated the procedure                            fairly well. The quality of the bowel preparation                            was excellent. The ileocecal valve, appendiceal                            orifice, and rectum were photographed. Scope In: 1:13:24 PM Scope Out: 1:42:38 PM Scope Withdrawal Time: 0 hours 27 minutes 11 seconds  Total Procedure Duration: 0 hours 29 minutes 14 seconds  Findings:      A 6 mm polyp was found in the mid transverse colon. The polyp was       sessile. The polyp was removed with a cold snare. Resection and       retrieval were complete.      Two sessile polyps were found in the sigmoid colon. The polyps were 2 to       3 mm in size. These polyps were removed with a cold biopsy forceps.       Resection and retrieval were complete.      Internal hemorrhoids were found during retroflexion. The hemorrhoids       were moderate. Three bands were successfully placed. A slow ooze       remained at the end of the procedure. Estimated blood loss was minimal. Impression:               - One 6 mm polyp in the mid transverse colon,                            removed with a cold snare. Resected and retrieved.                           - Two 2 to 3 mm polyps in the sigmoid colon,                             removed with a cold biopsy forceps. Resected and                            retrieved.                           - RECTAL BLEEDING DUE TO Internal hemorrhoids.                            Banded x3. Moderate Sedation:      Per Anesthesia Care Recommendation:           - Repeat colonoscopy in 5-10 years for surveillance                            WITH PROPOFOL.                           - High fiber diet.                           - Continue present medications.                           - Await pathology results.                           - Return to my office in 6 months.                           - Patient has a contact number available for  emergencies. The signs and symptoms of potential                            delayed complications were discussed with the                            patient. Return to normal activities tomorrow.                            Written discharge instructions were provided to the                            patient. Procedure Code(s):        --- Professional ---                           [phone], Colonoscopy, flexible; with removal of                            tumor(s), polyp(s), or other lesion(s) by snare                            technique                           [phone], Colonoscopy, flexible; with band ligation(s)                            (eg, hemorrhoids)                           45380, 59, Colonoscopy, flexible; with biopsy,                            single or multiple Diagnosis Code(s):        --- Professional ---                           D12.3, Benign neoplasm of transverse colon (hepatic                            flexure or splenic flexure)                           D12.5, Benign neoplasm of sigmoid colon                           K64.8, Other hemorrhoids                           K92.1, Melena (includes Hematochezia) CPT copyright 2016 American Medical Association. All rights reserved. The codes documented  in this report are preliminary and upon coder review may  be revised to meet current compliance requirements. Barney Drain, MD Barney Drain MD, MD 08/08/2017 1:55:39 PM This report has been signed electronically. Number of Addenda: 0

## 2017-08-08 NOTE — Telephone Encounter (Signed)
This encounter was created in error - please disregard.

## 2017-08-11 ENCOUNTER — Encounter: Payer: Self-pay | Admitting: Family Medicine

## 2017-08-11 DIAGNOSIS — K6389 Other specified diseases of intestine: Secondary | ICD-10-CM | POA: Insufficient documentation

## 2017-08-14 ENCOUNTER — Encounter (HOSPITAL_COMMUNITY): Payer: Self-pay | Admitting: Gastroenterology

## 2017-08-17 ENCOUNTER — Telehealth (HOSPITAL_COMMUNITY): Payer: Self-pay | Admitting: *Deleted

## 2017-08-17 ENCOUNTER — Other Ambulatory Visit: Payer: Self-pay

## 2017-08-17 ENCOUNTER — Telehealth: Payer: Self-pay | Admitting: Gastroenterology

## 2017-08-17 DIAGNOSIS — K625 Hemorrhage of anus and rectum: Secondary | ICD-10-CM

## 2017-08-17 NOTE — Telephone Encounter (Signed)
Mailing a letter for pt to call. Lab orders on file for Jan. 2019.

## 2017-08-17 NOTE — Telephone Encounter (Signed)
Pt pharmacy [facility] in [place] requesting 90 days supply for pt Divalproex Sod ER 250 mg 3 tabs QD. Pt medication was last filled on 07-24-2017 with 90 tabs 1 refill. Pt pharmacy number is [phone].

## 2017-08-17 NOTE — Telephone Encounter (Signed)
Reminder in epic °

## 2017-08-17 NOTE — Telephone Encounter (Signed)
Will not do it at this time as we may change the dose soon.

## 2017-08-17 NOTE — Telephone Encounter (Signed)
Tried to call. Vm not set up.

## 2017-08-17 NOTE — Telephone Encounter (Addendum)
Please call pt. She had THREE polypoid lesions removed and they are benign. HER BLOOD COUNT IS NORMAL. SHE SHOULD NOT TAKE IRON.   DRINK WATER TO KEEP URINE LIGHT YELLOW.  CONTINUE LINZESS 30 MINS PRIOR TO BREAKFAST TO PREVENT CONSTIPATION. IT MAY CAUSE EXPLOSIVE DIARRHEA. HOLD FOR CONSTIPATION.  CHECK CBC AND FERRITIN IN JAN 2019.  FOLLOW UP IN 6 MOS E30 CONSTIPATION/HEMORRHOIDS.   Next colonoscopy in 5-10 years.

## 2017-08-19 ENCOUNTER — Telehealth (HOSPITAL_COMMUNITY): Payer: Self-pay | Admitting: *Deleted

## 2017-08-19 NOTE — Telephone Encounter (Signed)
Pt pharmacy [facility] in [place] requesting 90 days supply for pt Divalproex Sod ER 500 mg QD. Pt medication was last filled on 07-24-2017. Pt pharmacy number is [phone].

## 2017-08-19 NOTE — Telephone Encounter (Signed)
Pt pharmacy [facility] in [place] requesting 90 days rx for pt Divalproex Sod ER 250 mg 3 tabs QD. Per pt chart, pt medication was last filled on 07-24-2017 with 90 tabs 1 refill. Pt pharmacy number is [phone].

## 2017-08-20 NOTE — Telephone Encounter (Signed)
Will not order this time as we may change the dose

## 2017-08-20 NOTE — Telephone Encounter (Signed)
Will not order at this time as we may change the dose

## 2017-08-21 NOTE — Telephone Encounter (Signed)
noted 

## 2017-08-28 ENCOUNTER — Telehealth: Payer: Self-pay | Admitting: Gastroenterology

## 2017-08-28 NOTE — Telephone Encounter (Signed)
Pt is aware of results. 

## 2017-08-28 NOTE — Telephone Encounter (Signed)
Pt is aware of her results from procedure.

## 2017-08-28 NOTE — Telephone Encounter (Signed)
PATIENT RECEIVED LETTER THAT WE WERE TRYING TO REACH HER  PLEASE CALL [phone]
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
## 2017-09-04 ENCOUNTER — Ambulatory Visit (INDEPENDENT_AMBULATORY_CARE_PROVIDER_SITE_OTHER): Payer: Medicare Other | Admitting: Psychiatry

## 2017-09-04 ENCOUNTER — Encounter (HOSPITAL_COMMUNITY): Payer: Self-pay | Admitting: Psychiatry

## 2017-09-04 VITALS — BP 113/80 | HR 75 | Ht 62.0 in | Wt 199.0 lb

## 2017-09-04 DIAGNOSIS — G4700 Insomnia, unspecified: Secondary | ICD-10-CM | POA: Diagnosis not present

## 2017-09-04 DIAGNOSIS — Z811 Family history of alcohol abuse and dependence: Secondary | ICD-10-CM | POA: Diagnosis not present

## 2017-09-04 DIAGNOSIS — F3181 Bipolar II disorder: Secondary | ICD-10-CM | POA: Diagnosis not present

## 2017-09-04 DIAGNOSIS — Z79899 Other long term (current) drug therapy: Secondary | ICD-10-CM

## 2017-09-04 DIAGNOSIS — Z818 Family history of other mental and behavioral disorders: Secondary | ICD-10-CM

## 2017-09-04 DIAGNOSIS — F1211 Cannabis abuse, in remission: Secondary | ICD-10-CM

## 2017-09-04 MED ORDER — ZIPRASIDONE HCL 20 MG PO CAPS: 20.0000 mg | ORAL_CAPSULE | Freq: Two times a day (BID) | ORAL | 1 refills | Status: DC

## 2017-09-04 NOTE — Patient Instructions (Signed)
1. Discontinue depakote 2. Start Geodon 20 mg twice a day with meals 3. Return to clinic in two months for 30 mins

## 2017-09-29 ENCOUNTER — Ambulatory Visit (INDEPENDENT_AMBULATORY_CARE_PROVIDER_SITE_OTHER): Payer: Medicare Other | Admitting: Family Medicine

## 2017-09-29 ENCOUNTER — Other Ambulatory Visit: Payer: Self-pay

## 2017-09-29 ENCOUNTER — Encounter: Payer: Self-pay | Admitting: Family Medicine

## 2017-09-29 VITALS — BP 104/72 | HR 92 | Temp 98.4°F | Resp 18 | Ht 62.0 in | Wt 202.0 lb

## 2017-09-29 DIAGNOSIS — K625 Hemorrhage of anus and rectum: Secondary | ICD-10-CM

## 2017-09-29 DIAGNOSIS — M25512 Pain in left shoulder: Secondary | ICD-10-CM | POA: Diagnosis not present

## 2017-09-29 DIAGNOSIS — Z23 Encounter for immunization: Secondary | ICD-10-CM | POA: Diagnosis not present

## 2017-09-29 DIAGNOSIS — F3162 Bipolar disorder, current episode mixed, moderate: Secondary | ICD-10-CM | POA: Diagnosis not present

## 2017-09-29 NOTE — Progress Notes (Signed)
Chief Complaint  Patient presents with  . Follow-up    3 month   Patient is here for her routine follow-up.  She continues to complain of pain in her left shoulder and neck area.  She states it keeps her awake every night.  She has tried over-the-counter medications.  She has tried ice and heat.  She has limited shoulder movement.  She requests referral to specialty. She is concerned because she still has rectal bleeding.  She had a colonoscopy in September and hemorrhoid banding.  This was supposed to solve the problem.  She states she still has blood with every bowel movement.  She is using MiraLAX.  She does not have a lot of constipation.  Sometimes she has pain.  I advised her that she needs to go back to her gastroenterologist, Dr. Oneida Alar, and explained that she is continuing to have bleeding. She does go to a mental health provider.  She is on multiple medications.  She is due for blood work next visit to check her hemoglobin A1c and labs (chronic antipsychotic use).  Patient Active Problem List   Diagnosis Date Noted  . Melanosis coli 08/11/2017  . Personal history of colonic polyps   . Bipolar disorder, current episode mixed, moderate ([place]) 07/24/2017  . Rectal bleeding 07/11/2017  . Night sweats 07/11/2017  . Hot flashes 07/11/2017  . H/O estrogen therapy 07/11/2017  . Bipolar II disorder ([place]) 06/16/2017  . Vitamin D deficiency 03/20/2017  . Drug-seeking behavior 01/03/2017  . Marijuana use, episodic 01/03/2017  . Tinea manuum 12/19/2016  . Tinea pedis of both feet 12/19/2016  . Hernia of anterior abdominal wall 12/19/2016  . Anxiety disorder 12/12/2016  . Constipation, chronic 12/12/2016  . Colon polyps 12/12/2016  . Tinnitus, left 12/12/2016  . Dental caries 12/12/2016  . Tobacco abuse 12/12/2016    Outpatient Encounter Medications as of 09/29/2017  Medication Sig  . Cholecalciferol (D 2000) 2000 units TABS Take 2,000 Units by mouth daily.  . cyclobenzaprine  (FLEXERIL) 5 MG tablet Take 1 tablet (5 mg total) by mouth 3 (three) times daily as needed for muscle spasms. May take 1 or 2 (Patient taking differently: Take 10 mg by mouth 3 (three) times daily as needed for muscle spasms. )  . divalproex (DEPAKOTE ER) 250 MG 24 hr tablet Take 3 tablets (750 mg total) by mouth daily.  Marland Kitchen estradiol (ESTRACE) 1 MG tablet Take 1 tablet (1 mg total) by mouth daily.  . IRON PO Take 2,000 mg by mouth daily.   . Iron-Vitamins (GERITOL COMPLETE PO) Take 1 tablet by mouth daily.   Marland Kitchen linaclotide (LINZESS) 145 MCG CAPS capsule 1 PO 30 mins prior to OR WITH your first meal  . naproxen (NAPROSYN) 500 MG tablet Take 1 tablet (500 mg total) by mouth 2 (two) times daily with a meal. For neck pain  . polyethylene glycol powder (GLYCOLAX/MIRALAX) powder Take 17 g by mouth daily.  . ziprasidone (GEODON) 20 MG capsule Take 1 capsule (20 mg total) by mouth 2 (two) times daily with a meal.   No facility-administered encounter medications on file as of 09/29/2017.     No Known Allergies  Review of Systems  Constitutional: Negative for fatigue and unexpected weight change.  HENT: Positive for dental problem.   Eyes: Negative for photophobia and visual disturbance.  Respiratory: Negative for cough and shortness of breath.   Cardiovascular: Negative for chest pain and leg swelling.  Gastrointestinal: Positive for blood in stool  and constipation. Negative for abdominal distention and abdominal pain.       Rectal bleeding persists after hemorrhoid banding  Genitourinary: Negative for difficulty urinating, dyspareunia and vaginal discharge.  Musculoskeletal: Positive for arthralgias, neck pain and neck stiffness. Negative for back pain.       Pain in left neck and shoulder  Neurological: Negative for dizziness and headaches.  Psychiatric/Behavioral: Positive for behavioral problems. Negative for dysphoric mood. The patient is not nervous/anxious.        Under care of psychiatry     BP 104/72 (BP Location: Left Arm, Patient Position: Sitting, Cuff Size: Normal)   Pulse 92   Temp 98.4 F (36.9 C) (Temporal)   Resp 18   Ht 5\' 2"  (1.575 m)   Wt 202 lb 0.6 oz (91.6 kg)   SpO2 98%   BMI 36.95 kg/m   Physical Exam  Constitutional: She appears well-developed and well-nourished. No distress.  HENT:  Head: Normocephalic and atraumatic.  Mouth/Throat: Oropharynx is clear and moist.  Neck: Normal range of motion. Neck supple.  Tender right upper body trapezius into lower neck  Cardiovascular: Normal rate, regular rhythm and normal heart sounds.  Pulmonary/Chest: Effort normal and breath sounds normal.  Musculoskeletal: Normal range of motion. She exhibits no edema.  Pain around left shoulder, to all areas palpated including clavicle, anterior joint, subacromial space, and scapula.  She can abduct to 80 degrees and has pain with external more than internal rotation.  Equivocal empty can testing.  Tenderness in trapezius and. paraspinous thoracic region.  Neck range of motion is full  Neurological: She is alert. She displays normal reflexes.  Upper extremities with normal strength, sensation and reflexes  Psychiatric: Her affect is labile. She is agitated. She expresses impulsivity.   mildly rapid speech, poor judgement at times    ASSESSMENT/PLAN:  1. Need for influenza vaccination Vaccine administered - Flu Vaccine QUAD 36+ mos IM  2. Nontraumatic shoulder pain, left Possible rotator cuff, shoulder problem.  Difficult to tell with widespread pain complaint. - Ambulatory referral to Orthopedic Surgery  3. Rectal bleed Known hemorrhoids  4. Bipolar disorder, current episode mixed, moderate ([place]) Under care of psychiatry.  Needs yearly labs   Patient Instructions  I am referring you to an orthopedic for the shoulder pain Call Dr Oneida Alar for a follow up for the bleeding  See me in 3-4 months  Blood work next time    Raylene Everts, MD

## 2017-09-29 NOTE — Patient Instructions (Addendum)
I am referring you to an orthopedic for the shoulder pain Call Dr Oneida Alar for a follow up for the bleeding  See me in 3-4 months  Blood work next time

## 2017-10-04 ENCOUNTER — Ambulatory Visit: Payer: Medicare Other | Admitting: Orthopaedic Surgery

## 2017-10-17 ENCOUNTER — Ambulatory Visit: Payer: Medicare Other | Admitting: Orthopaedic Surgery

## 2017-10-17 ENCOUNTER — Encounter: Payer: Self-pay | Admitting: Orthopaedic Surgery

## 2017-10-19 ENCOUNTER — Other Ambulatory Visit: Payer: Self-pay

## 2017-10-19 DIAGNOSIS — K625 Hemorrhage of anus and rectum: Secondary | ICD-10-CM

## 2017-10-25 ENCOUNTER — Telehealth: Payer: Self-pay | Admitting: Gastroenterology

## 2017-10-25 NOTE — Telephone Encounter (Signed)
Recall for cbc and ferratin

## 2017-10-26 ENCOUNTER — Other Ambulatory Visit: Payer: Self-pay

## 2017-10-26 ENCOUNTER — Emergency Department (HOSPITAL_COMMUNITY)
Admission: EM | Admit: 2017-10-26 | Discharge: 2017-10-26 | Disposition: A | Discharge status: Home / Self Care | Payer: Medicare Other | Attending: Emergency Medicine | Admitting: Emergency Medicine

## 2017-10-26 ENCOUNTER — Emergency Department (HOSPITAL_COMMUNITY): Payer: Medicare Other

## 2017-10-26 ENCOUNTER — Encounter (HOSPITAL_COMMUNITY): Payer: Self-pay | Admitting: *Deleted

## 2017-10-26 DIAGNOSIS — J209 Acute bronchitis, unspecified: Secondary | ICD-10-CM | POA: Insufficient documentation

## 2017-10-26 DIAGNOSIS — F17210 Nicotine dependence, cigarettes, uncomplicated: Secondary | ICD-10-CM | POA: Insufficient documentation

## 2017-10-26 DIAGNOSIS — J0100 Acute maxillary sinusitis, unspecified: Secondary | ICD-10-CM | POA: Insufficient documentation

## 2017-10-26 DIAGNOSIS — R0602 Shortness of breath: Secondary | ICD-10-CM | POA: Diagnosis not present

## 2017-10-26 DIAGNOSIS — Z79899 Other long term (current) drug therapy: Secondary | ICD-10-CM | POA: Diagnosis not present

## 2017-10-26 DIAGNOSIS — R05 Cough: Secondary | ICD-10-CM | POA: Diagnosis not present

## 2017-10-26 MED ORDER — AMOXICILLIN 500 MG PO CAPS
500.0000 mg | ORAL_CAPSULE | Freq: Three times a day (TID) | ORAL | 0 refills | Status: AC
Start: 2017-10-26 — End: 2017-11-05

## 2017-10-26 MED ORDER — BENZONATATE 100 MG PO CAPS: 200.0000 mg | ORAL_CAPSULE | Freq: Three times a day (TID) | ORAL | 0 refills | Status: DC | PRN

## 2017-10-26 MED ORDER — ALBUTEROL SULFATE HFA 108 (90 BASE) MCG/ACT IN AERS
2.0000 | INHALATION_SPRAY | Freq: Once | RESPIRATORY_TRACT | Status: DC
  Filled 2017-10-26: qty 6.7

## 2017-10-26 NOTE — ED Notes (Signed)
Pt returned from xray

## 2017-10-26 NOTE — ED Triage Notes (Signed)
Pt c/o yellow/green productive cough since Sat. Pt also reports hot and cold sweats and difficulty breathing since Tuesday. Pt reports she took some OTC cough medicine with some relief.

## 2017-10-26 NOTE — Telephone Encounter (Signed)
On file to be mailed.

## 2017-10-26 NOTE — Discharge Instructions (Signed)
You may use 2 puffs of the albuterol inhaler given every 4 hours if you are coughing or wheezing.  Take the entire course of the antibiotics.  I have also prescribed medication to help relieve your frequent coughing.  Follow-up with your doctor if symptoms persist or worsen.  Use Tylenol or Motrin for fever and body ache.  Consider smoking cessation which can be making your symptoms worse.

## 2017-10-26 NOTE — ED Provider Notes (Signed)
[facility] EMERGENCY DEPARTMENT Provider Note   CSN: [id number] Arrival date & time: 10/26/17  1629     History   Chief Complaint Chief Complaint  Patient presents with  . Cough    HPI Regina Carson is a 51 y.o. female with past medical history as outlined below presenting with a 5-day history of cough, subjective fever, shortness of breath with wheezing nasal congestion with thick green blood tinged nasal discharge along with postnasal drip.  She endorses feeling short of breath, mostly at night when she noticed herself wheezing.  She reports  bilateral cheek pain without swelling.  She has taken OTC cough suppressants without relief of symptoms.  She denies chest pain, headache, neck pain or stiffness.  She is a smoker, stating 1 pack of cigarettes will last her 3 days.  She is found no alleviators for her symptoms.    The history is provided by the patient.    Past Medical History:  Diagnosis Date  . Anemia   . Anxiety   . Arthritis   . Bipolar 1 disorder ([place])   . Depression   . Fibrocystic breast disease   . History of cervical cancer   . History of kidney stones   . Hyperlipidemia   . Schizoaffective disorder ([place])   . Tobacco abuse 12/12/2016  . Vaginal Pap smear following hysterectomy for malignancy 07/11/2017  . Vaginal Pap smear, abnormal     Patient Active Problem List   Diagnosis Date Noted  . Melanosis coli 08/11/2017  . Personal history of colonic polyps   . Bipolar disorder, current episode mixed, moderate ([place]) 07/24/2017  . Rectal bleeding 07/11/2017  . Night sweats 07/11/2017  . Hot flashes 07/11/2017  . H/O estrogen therapy 07/11/2017  . Bipolar II disorder ([place]) 06/16/2017  . Vitamin D deficiency 03/20/2017  . Drug-seeking behavior 01/03/2017  . Marijuana use, episodic 01/03/2017  . Tinea manuum 12/19/2016  . Tinea pedis of both feet 12/19/2016  . Hernia of anterior abdominal wall 12/19/2016  . Anxiety disorder 12/12/2016  .  Constipation, chronic 12/12/2016  . Colon polyps 12/12/2016  . Tinnitus, left 12/12/2016  . Dental caries 12/12/2016  . Tobacco abuse 12/12/2016    Past Surgical History:  Procedure Laterality Date  . ABDOMINAL HYSTERECTOMY  1988   class 4 pap smear  . APPENDECTOMY  2015  . COLONOSCOPY WITH PROPOFOL N/A 08/08/2017   Procedure: COLONOSCOPY WITH PROPOFOL;  Surgeon: Danie Binder, MD;  Location[facility] END[facility];  Service: Endoscopy;  Laterality: N/A;  1:45pm  . HEMORRHOID BANDING N/A 08/08/2017   Procedure: HEMORRHOID BANDING;  Surgeon: Danie Binder, MD;  Location: [facility] END[facility];  Service: Endoscopy;  Laterality: N/A;  . POLYPECTOMY  08/08/2017   Procedure: POLYPECTOMY;  Surgeon: Danie Binder, MD;  Location: [facility] END[facility];  Service: Endoscopy;;  colon  . SUBLINGUAL CYST EXCISION     right shoulder    OB History    Gravida Para Term Preterm AB Living   1         1   SAB TAB Ectopic Multiple Live Births                   Home Medications    Prior to Admission medications   Medication Sig Start Date End Date Taking? Authorizing Provider  amoxicillin (AMOXIL) 500 MG capsule Take 1 capsule (500 mg total) by mouth 3 (three) times daily for 10 days. 10/26/17 11/05/17  Evalee Jefferson, PA-C  benzonatate (  TESSALON) 100 MG capsule Take 2 capsules (200 mg total) by mouth 3 (three) times daily as needed. 10/26/17   Evalee Jefferson, PA-C  Cholecalciferol (D 2000) 2000 units TABS Take 2,000 Units by mouth daily.    [provider]  cyclobenzaprine (FLEXERIL) 5 MG tablet Take 1 tablet (5 mg total) by mouth 3 (three) times daily as needed for muscle spasms. May take 1 or 2 Patient taking differently: Take 10 mg by mouth 3 (three) times daily as needed for muscle spasms.  06/29/17   Raylene Everts, MD  divalproex (DEPAKOTE ER) 250 MG 24 hr tablet Take 3 tablets (750 mg total) by mouth daily. 07/24/17   Norman Clay, MD  estradiol (ESTRACE) 1 MG tablet Take 1 tablet (1 mg total) by mouth  daily. 07/11/17   Derrek Monaco A, NP  IRON PO Take 2,000 mg by mouth daily.     [provider]  Iron-Vitamins (GERITOL COMPLETE PO) Take 1 tablet by mouth daily.     [provider]  linaclotide Rolan Lipa) 145 MCG CAPS capsule 1 PO 30 mins prior to OR WITH your first meal 08/08/17   Fields, Marga Melnick, MD  naproxen (NAPROSYN) 500 MG tablet Take 1 tablet (500 mg total) by mouth 2 (two) times daily with a meal. For neck pain 06/29/17   Raylene Everts, MD  polyethylene glycol powder (GLYCOLAX/MIRALAX) powder Take 17 g by mouth daily. 03/28/17   Raylene Everts, MD  ziprasidone (GEODON) 20 MG capsule Take 1 capsule (20 mg total) by mouth 2 (two) times daily with a meal. 09/04/17   Norman Clay, MD    Family History Family History  Problem Relation Age of Onset  . Hyperlipidemia Mother   . Hypertension Mother   . Liver cancer Father   . Alcohol abuse Father   . Mental illness Sister   . Mental illness Brother   . Colon cancer Neg Hx   . Colon polyps Neg Hx     Social History Social History   Tobacco Use  . Smoking status: Light Tobacco Smoker    Packs/day: 0.50    Years: 20.00    Pack years: 10.00    Types: Cigarettes  . Smokeless tobacco: Never Used  . Tobacco comment: one pack a week  Substance Use Topics  . Alcohol use: Yes    Comment: social , 01-12-2017 per pt yes on the Weekends  . Drug use: Yes    Frequency: 1.0 times per week    Types: Marijuana    Comment: last used July 2018.     Allergies   Patient has no known allergies.   Review of Systems Review of Systems  Constitutional: Positive for fever. Negative for chills.  HENT: Positive for congestion, postnasal drip, rhinorrhea and sinus pain. Negative for ear pain, sinus pressure, sore throat, trouble swallowing and voice change.   Eyes: Negative for discharge.  Respiratory: Positive for cough, shortness of breath and wheezing. Negative for stridor.   Cardiovascular: Negative for chest  pain.  Gastrointestinal: Negative for abdominal pain.  Genitourinary: Negative.   Musculoskeletal: Positive for myalgias.     Physical Exam Updated Vital Signs BP 108/86   Pulse 87   Temp 98 F (36.7 C) (Oral)   Resp 20   Ht 5\' 2"  (1.575 m)   Wt 88.9 kg (196 lb)   SpO2 98%   BMI 35.85 kg/m   Physical Exam  Constitutional: She is oriented to person, place, and time. She  appears well-developed and well-nourished.  HENT:  Head: Normocephalic and atraumatic.  Right Ear: Tympanic membrane and ear canal normal.  Left Ear: Tympanic membrane and ear canal normal.  Nose: Mucosal edema and rhinorrhea present.  Mouth/Throat: Uvula is midline, oropharynx is clear and moist and mucous membranes are normal. No oropharyngeal exudate, posterior oropharyngeal edema, posterior oropharyngeal erythema or tonsillar abscesses.  Eyes: Conjunctivae are normal.  Cardiovascular: Normal rate and normal heart sounds.  Pulmonary/Chest: Effort normal. No respiratory distress. She has no wheezes. She has rhonchi in the right lower field. She has no rales.  Rhonchi right base.  No current wheeze appreciated.  Abdominal: Soft. There is no tenderness.  Musculoskeletal: Normal range of motion.  Neurological: She is alert and oriented to person, place, and time.  Skin: Skin is warm and dry. No rash noted.  Psychiatric: She has a normal mood and affect.     ED Treatments / Results  Labs (all labs ordered are listed, but only abnormal results are displayed) Labs Reviewed - No data to display  EKG  EKG Interpretation None       Radiology Dg Chest 2 View  Result Date: 10/26/2017 CLINICAL DATA:  Cough EXAM: CHEST  2 VIEW COMPARISON:  12/13/2013.  CT 01/13/2014. FINDINGS: Heart size is normal. Mediastinal shadows are normal. The lungs are clear. The vascularity is normal. Minimal blunting of the costophrenic angles, likely chronic based on the prior CT. No significant bone finding. IMPRESSION: No  active cardiopulmonary disease. Electronically Signed   By: Nelson Chimes M.D.   On: 10/26/2017 17:17    Procedures Procedures (including critical care time)  Medications Ordered in ED Medications  albuterol (PROVENTIL HFA;VENTOLIN HFA) 108 (90 Base) MCG/ACT inhaler 2 puff (not administered)     Initial Impression / Assessment and Plan / ED Course  I have reviewed the triage vital signs and the nursing notes.  Pertinent labs & imaging results that were available during my care of the patient were reviewed by me and considered in my medical decision making (see chart for details).     Patient with exam suggesting maxillary sinusitis which I think has also triggered her to have acute bronchitis symptoms.  She was placed on Amoxil, Tessalon, albuterol MDI given.  Discussed smoking cessation.  Plan follow-up with PCP if symptoms persist or worsen.  Advised Motrin or Tylenol if needed for fevers and body aches.   Final Clinical Impressions(s) / ED Diagnoses   Final diagnoses:  Acute maxillary sinusitis, recurrence not specified  Acute bronchitis, unspecified organism    ED Discharge Orders        Ordered    benzonatate (TESSALON) 100 MG capsule  3 times daily PRN     10/26/17 1737    amoxicillin (AMOXIL) 500 MG capsule  3 times daily     10/26/17 1737       Evalee Jefferson, PA-C 10/26/17 1738    Fredia Sorrow, MD 10/28/17 1540
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
## 2017-10-31 ENCOUNTER — Ambulatory Visit (HOSPITAL_COMMUNITY): Payer: Medicare Other | Admitting: Psychiatry

## 2017-10-31 ENCOUNTER — Encounter (HOSPITAL_COMMUNITY): Payer: Self-pay | Admitting: Psychiatry

## 2017-10-31 ENCOUNTER — Ambulatory Visit (INDEPENDENT_AMBULATORY_CARE_PROVIDER_SITE_OTHER): Payer: Medicare Other | Admitting: Psychiatry

## 2017-10-31 VITALS — BP 116/85 | HR 80 | Ht 62.0 in | Wt 203.0 lb

## 2017-10-31 DIAGNOSIS — Z811 Family history of alcohol abuse and dependence: Secondary | ICD-10-CM | POA: Diagnosis not present

## 2017-10-31 DIAGNOSIS — G4700 Insomnia, unspecified: Secondary | ICD-10-CM | POA: Diagnosis not present

## 2017-10-31 DIAGNOSIS — F17210 Nicotine dependence, cigarettes, uncomplicated: Secondary | ICD-10-CM

## 2017-10-31 DIAGNOSIS — F1210 Cannabis abuse, uncomplicated: Secondary | ICD-10-CM

## 2017-10-31 DIAGNOSIS — Z818 Family history of other mental and behavioral disorders: Secondary | ICD-10-CM | POA: Diagnosis not present

## 2017-10-31 DIAGNOSIS — F419 Anxiety disorder, unspecified: Secondary | ICD-10-CM | POA: Diagnosis not present

## 2017-10-31 DIAGNOSIS — R450 Nervousness: Secondary | ICD-10-CM | POA: Diagnosis not present

## 2017-10-31 DIAGNOSIS — F3181 Bipolar II disorder: Secondary | ICD-10-CM | POA: Diagnosis not present

## 2017-10-31 MED ORDER — ZIPRASIDONE HCL 20 MG PO CAPS: 20.0000 mg | ORAL_CAPSULE | Freq: Two times a day (BID) | ORAL | 2 refills | Status: DC

## 2017-10-31 NOTE — Patient Instructions (Signed)
1. Start Geodon 20 mg twice a day with meals 2. Return to clinic in three months for 30 mins
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
## 2017-12-07 ENCOUNTER — Encounter: Payer: Self-pay | Admitting: Gastroenterology

## 2017-12-18 ENCOUNTER — Ambulatory Visit (INDEPENDENT_AMBULATORY_CARE_PROVIDER_SITE_OTHER): Payer: Medicare Other | Admitting: Family Medicine

## 2017-12-18 ENCOUNTER — Encounter: Payer: Self-pay | Admitting: Family Medicine

## 2017-12-18 ENCOUNTER — Other Ambulatory Visit: Payer: Self-pay

## 2017-12-18 VITALS — BP 102/70 | HR 76 | Temp 97.3°F | Resp 16 | Ht 62.0 in | Wt 199.0 lb

## 2017-12-18 DIAGNOSIS — R031 Nonspecific low blood-pressure reading: Secondary | ICD-10-CM

## 2017-12-18 DIAGNOSIS — R55 Syncope and collapse: Secondary | ICD-10-CM | POA: Diagnosis not present

## 2017-12-18 DIAGNOSIS — Z9114 Patient's other noncompliance with medication regimen: Secondary | ICD-10-CM | POA: Diagnosis not present

## 2017-12-18 DIAGNOSIS — M7989 Other specified soft tissue disorders: Secondary | ICD-10-CM | POA: Diagnosis not present

## 2017-12-18 DIAGNOSIS — K625 Hemorrhage of anus and rectum: Secondary | ICD-10-CM

## 2017-12-18 NOTE — Progress Notes (Signed)
Chief Complaint  Patient presents with  . Rash    buttocks x 3 days  . Fatigue   Patient is here for a routine follow-up.  She has multiple complaints. First she complains of fatigue.  She states she is more tired than usual, "I have no get up and go" 2 she complains of spells.  She says if she stands up quickly she feels like she is going to faint.  She gets hot and sweaty, gets queasy, has to sit down quickly in order not to feel like she will fall down.  She has these spells daily.  She has not fainted or fallen. She states that on a couple of occasions recently she has had bright red blood again in the toilet.  She states she is compliant with her MiraLAX and her Linzess.  She has not had constipation or straining at stool, no pain with bowel movement.  She had a colonoscopy last fall with banding of internal hemorrhoids. Patient tells me she has swelling in her hands and feet.  Her fingers are diffusely swollen.  They have a tubular appearance.  There is also some pain.  She states that this happens periodically, more in the winter, more for the last couple of years. She states her appetite is good. She states she has not had any fever or chills, no recent illness. She is under the care of psychiatry for her bipolar illness.  She is not taking medicine.  She states that she was prescribed Geodon but never got it filled.  Patient Active Problem List   Diagnosis Date Noted  . Finger swelling 12/18/2017  . Noncompliance with medication regimen 12/18/2017  . Melanosis coli 08/11/2017  . Personal history of colonic polyps   . Bipolar disorder, current episode mixed, moderate ([place]) 07/24/2017  . Rectal bleeding 07/11/2017  . H/O estrogen therapy 07/11/2017  . Vitamin D deficiency 03/20/2017  . Drug-seeking behavior 01/03/2017  . Marijuana use, episodic 01/03/2017  . Tinea manuum 12/19/2016  . Tinea pedis of both feet 12/19/2016  . Hernia of anterior abdominal wall 12/19/2016  .  Constipation, chronic 12/12/2016  . Tinnitus, left 12/12/2016  . Dental caries 12/12/2016  . Tobacco abuse 12/12/2016    Outpatient Encounter Medications as of 12/18/2017  Medication Sig  . Cholecalciferol (D 2000) 2000 units TABS Take 2,000 Units by mouth daily.  . cyclobenzaprine (FLEXERIL) 5 MG tablet Take 1 tablet (5 mg total) by mouth 3 (three) times daily as needed for muscle spasms. May take 1 or 2 (Patient taking differently: Take 10 mg by mouth 3 (three) times daily as needed for muscle spasms. )  . estradiol (ESTRACE) 1 MG tablet Take 1 tablet (1 mg total) by mouth daily.  . IRON PO Take 2,000 mg by mouth daily.   . Iron-Vitamins (GERITOL COMPLETE PO) Take 1 tablet by mouth daily.   Marland Kitchen linaclotide (LINZESS) 145 MCG CAPS capsule 1 PO 30 mins prior to OR WITH your first meal  . naproxen (NAPROSYN) 500 MG tablet Take 1 tablet (500 mg total) by mouth 2 (two) times daily with a meal. For neck pain  . polyethylene glycol powder (GLYCOLAX/MIRALAX) powder Take 17 g by mouth daily.  . ziprasidone (GEODON) 20 MG capsule-not taking Take 1 capsule (20 mg total) by mouth 2 (two) times daily with a meal.   No facility-administered encounter medications on file as of 12/18/2017.     No Known Allergies  Review of Systems  Constitutional: Positive  for fatigue. Negative for activity change, appetite change, chills, fever and unexpected weight change.  HENT: Positive for dental problem. Negative for congestion.   Eyes: Negative for photophobia and visual disturbance.  Respiratory: Negative for cough and shortness of breath.   Cardiovascular: Negative for chest pain and palpitations.  Gastrointestinal: Positive for abdominal distention and blood in stool. Negative for constipation, diarrhea and rectal pain.  Genitourinary: Negative for difficulty urinating, flank pain and frequency.  Musculoskeletal: Positive for arthralgias, joint swelling and myalgias. Negative for back pain.       General  achiness  Skin: Positive for color change.       Fingers "red"  Neurological: Positive for light-headedness. Negative for seizures, syncope and headaches.  Psychiatric/Behavioral: Positive for decreased concentration and sleep disturbance. The patient is nervous/anxious.     BP 102/70   Pulse 76   Temp (!) 97.3 F (36.3 C) (Temporal)   Resp 16   Ht _0  (1.575 m)   Wt 199 lb 0.6 oz (90.3 kg)   SpO2 98%   BMI 36.40 kg/m   Physical Exam  Constitutional: She is oriented to person, place, and time. She appears well-developed and well-nourished. She appears distressed.  Appears tired  HENT:  Head: Normocephalic and atraumatic.  Right Ear: External ear normal.  Left Ear: External ear normal.  Mouth/Throat: Oropharynx is clear and moist.  Poor dentition  Eyes: EOM are normal. Pupils are equal, round, and reactive to light.  Neck: Normal range of motion. Neck supple. No thyromegaly present.  Cardiovascular: Normal rate, regular rhythm and normal heart sounds.  Pulmonary/Chest: Effort normal and breath sounds normal.  Abdominal: Soft. Bowel sounds are normal. She exhibits no distension. There is no tenderness.  Rounded appearance  Musculoskeletal: Normal range of motion. She exhibits edema.  Neurological: She is alert and oriented to person, place, and time.  Skin: There is erythema.  Fingers with tubular swelling. Loss of wrinkling DIP, slight erythema, diffuse mild tenderness  Psychiatric: She has a normal mood and affect. Her behavior is normal.    ASSESSMENT/PLAN:  1. Finger swelling   Wonder about early scleroderma - ANA Direct w/Reflex if Positive - Sed Rate (ESR) - CK (Creatine Kinase) - Rheumatoid Factor  2. BRBPR (bright red blood per rectum) - CBC with Differential/Platelet  3. Pre-syncope Concern for anemia  4. Low blood pressure reading With lightheadedness  5. Noncompliance with medication regimen Not on her psychiatry medicines   Patient  Instructions  Drink water Get labs by Thursday AS PROMISED See me in 2-3 weeks    Raylene Everts, MD

## 2017-12-18 NOTE — Patient Instructions (Signed)
Drink water Get labs by Thursday AS PROMISED See me in 2-3 weeks

## 2017-12-25 DIAGNOSIS — K625 Hemorrhage of anus and rectum: Secondary | ICD-10-CM | POA: Diagnosis not present

## 2017-12-25 DIAGNOSIS — Z23 Encounter for immunization: Secondary | ICD-10-CM | POA: Diagnosis not present

## 2017-12-25 DIAGNOSIS — M25512 Pain in left shoulder: Secondary | ICD-10-CM | POA: Diagnosis not present

## 2017-12-26 LAB — HEMOGLOBIN A1C
EAG (MMOL/L): 5.4 (calc)
HEMOGLOBIN A1C: 5 %{Hb} (ref <5.7)
Mean Plasma Glucose: 97 (calc)

## 2017-12-26 LAB — RHEUMATOID FACTOR: Rhuematoid fact SerPl-aCnc: <14 IU/mL (ref <14)

## 2017-12-26 LAB — VITAMIN D 25 HYDROXY (VIT D DEFICIENCY, FRACTURES): VIT D 25 HYDROXY: 21 ng/mL — AB (ref 30–100)

## 2017-12-26 LAB — COMPLETE METABOLIC PANEL WITH GFR
AG Ratio: 1.4 (calc) (ref 1.0–2.5)
ALT: 11 U/L (ref 6–29)
AST: 16 U/L (ref 10–35)
Albumin: 4.2 g/dL (ref 3.6–5.1)
Alkaline phosphatase (APISO): 47 U/L (ref 33–130)
BUN: 9 mg/dL (ref 7–25)
CALCIUM: 9.5 mg/dL (ref 8.6–10.4)
CO2: 30 mmol/L (ref 20–32)
CREATININE: 0.96 mg/dL (ref 0.50–1.05)
Chloride: 105 mmol/L (ref 98–110)
GFR, EST NON AFRICAN AMERICAN: 68 mL/min/{1.73_m2} (ref >=60)
GFR, Est African American: 79 mL/min/{1.73_m2} (ref >=60)
GLOBULIN: 3 g/dL (ref 1.9–3.7)
GLUCOSE: 78 mg/dL (ref 65–139)
Potassium: 4.9 mmol/L (ref 3.5–5.3)
SODIUM: 141 mmol/L (ref 135–146)
Total Bilirubin: 0.5 mg/dL (ref 0.2–1.2)
Total Protein: 7.2 g/dL (ref 6.1–8.1)

## 2017-12-26 LAB — CBC WITH DIFFERENTIAL/PLATELET
BASOS ABS: 48 {cells}/uL (ref 0–200)
Basophils Relative: 0.6 %
EOS ABS: 168 {cells}/uL (ref 15–500)
Eosinophils Relative: 2.1 %
HEMATOCRIT: 45.9 % — AB (ref 35.0–45.0)
Hemoglobin: 15.4 g/dL (ref 11.7–15.5)
Lymphs Abs: 3560 cells/uL (ref 850–3900)
MCH: 31.2 pg (ref 27.0–33.0)
MCHC: 33.6 g/dL (ref 32.0–36.0)
MCV: 92.9 fL (ref 80.0–100.0)
MPV: 10.2 fL (ref 7.5–12.5)
Monocytes Relative: 5.2 %
NEUTROS PCT: 47.6 %
Neutro Abs: 3808 cells/uL (ref 1500–7800)
PLATELETS: 313 10*3/uL (ref 140–400)
RBC: 4.94 10*6/uL (ref 3.80–5.10)
RDW: 12 % (ref 11.0–15.0)
TOTAL LYMPHOCYTE: 44.5 %
WBC: 8 10*3/uL (ref 3.8–10.8)
WBCMIX: 416 {cells}/uL (ref 200–950)

## 2017-12-26 LAB — FERRITIN: FERRITIN: 170 ng/mL (ref 10–232)

## 2017-12-26 LAB — URINALYSIS, ROUTINE W REFLEX MICROSCOPIC
Bilirubin Urine: NEGATIVE
Glucose, UA: NEGATIVE
HGB URINE DIPSTICK: NEGATIVE
KETONES UR: NEGATIVE
LEUKOCYTES UA: NEGATIVE
NITRITE: NEGATIVE
PH: 5.5 (ref 5.0–8.0)
PROTEIN: NEGATIVE
Specific Gravity, Urine: 1.011 (ref 1.001–1.03)

## 2017-12-26 LAB — CK: CK TOTAL: 86 U/L (ref 29–143)

## 2017-12-26 LAB — SEDIMENTATION RATE: Sed Rate: 2 mm/h (ref 0–30)

## 2017-12-27 ENCOUNTER — Encounter: Payer: Self-pay | Admitting: Family Medicine

## 2017-12-28 NOTE — Progress Notes (Signed)
No phone number listed. Letter mailed with results.

## 2018-01-03 ENCOUNTER — Telehealth: Payer: Self-pay | Admitting: Family Medicine

## 2018-01-04 ENCOUNTER — Other Ambulatory Visit (HOSPITAL_COMMUNITY)
Admission: RE | Admit: 2018-01-04 | Discharge: 2018-01-04 | Disposition: A | Discharge status: Home / Self Care | Payer: Medicare Other | Source: Ambulatory Visit | Attending: Family Medicine | Admitting: Family Medicine

## 2018-01-04 ENCOUNTER — Other Ambulatory Visit: Payer: Self-pay

## 2018-01-04 ENCOUNTER — Other Ambulatory Visit (HOSPITAL_COMMUNITY): Payer: Self-pay | Admitting: Psychiatry

## 2018-01-04 ENCOUNTER — Encounter: Payer: Self-pay | Admitting: Family Medicine

## 2018-01-04 ENCOUNTER — Ambulatory Visit (INDEPENDENT_AMBULATORY_CARE_PROVIDER_SITE_OTHER): Payer: Medicare Other | Admitting: Family Medicine

## 2018-01-04 VITALS — BP 108/78 | HR 76 | Temp 98.0°F | Resp 18 | Ht 62.0 in | Wt 198.1 lb

## 2018-01-04 DIAGNOSIS — M62830 Muscle spasm of back: Secondary | ICD-10-CM

## 2018-01-04 DIAGNOSIS — M546 Pain in thoracic spine: Secondary | ICD-10-CM | POA: Diagnosis not present

## 2018-01-04 DIAGNOSIS — R8290 Unspecified abnormal findings in urine: Secondary | ICD-10-CM

## 2018-01-04 LAB — POCT URINALYSIS DIPSTICK
BILIRUBIN UA: NEGATIVE
Blood, UA: NEGATIVE
GLUCOSE UA: NEGATIVE
Ketones, UA: NEGATIVE
LEUKOCYTES UA: NEGATIVE
Nitrite, UA: NEGATIVE
Protein, UA: NEGATIVE
Spec Grav, UA: 1.02 (ref 1.010–1.025)
Urobilinogen, UA: 0.2 E.U./dL
pH, UA: 6 (ref 5.0–8.0)

## 2018-01-04 MED ORDER — KETOROLAC TROMETHAMINE 60 MG/2ML IM SOLN
60.0000 mg | Freq: Once | INTRAMUSCULAR | Status: AC
  Administered 2018-01-04: 60 mg via INTRAMUSCULAR

## 2018-01-04 MED ORDER — ZIPRASIDONE HCL 20 MG PO CAPS: 20.0000 mg | ORAL_CAPSULE | Freq: Two times a day (BID) | ORAL | 0 refills | Status: DC

## 2018-01-04 MED ORDER — DIAZEPAM 5 MG PO TABS: 5.0000 mg | ORAL_TABLET | Freq: Four times a day (QID) | ORAL | 1 refills | Status: DC | PRN

## 2018-01-04 MED ORDER — METHYLPREDNISOLONE ACETATE 80 MG/ML IJ SUSP
80.0000 mg | Freq: Once | INTRAMUSCULAR | Status: AC
  Administered 2018-01-04: 80 mg via INTRAMUSCULAR

## 2018-01-04 NOTE — Progress Notes (Signed)
Chief Complaint  Patient presents with  . Back Pain  . Urinary Frequency  Patient is here for an acute complaint.  She states that she mopped her floors vigorously on Saturday.  Woke up Sunday morning with a severely painful right low back.  Is localized to the right low back.  It hurts with deep breath.  It hurts with movement.  No radiation.  It is a severe pain.  She is very dramatic and vocalizing with her severe pain.  She states she also has had some urinary frequency.  She states her urine is malodorous.  No dysuria no hematuria.  No sweats chills or fever.  No nausea or vomiting.  She states she remotely had a kidney stone but this feels different.  No numbness or weakness into the legs.   Patient Active Problem List   Diagnosis Date Noted  . Finger swelling 12/18/2017  . Noncompliance with medication regimen 12/18/2017  . Melanosis coli 08/11/2017  . Personal history of colonic polyps   . Bipolar disorder, current episode mixed, moderate ([place]) 07/24/2017  . Rectal bleeding 07/11/2017  . H/O estrogen therapy 07/11/2017  . Vitamin D deficiency 03/20/2017  . Drug-seeking behavior 01/03/2017  . Marijuana use, episodic 01/03/2017  . Tinea manuum 12/19/2016  . Tinea pedis of both feet 12/19/2016  . Hernia of anterior abdominal wall 12/19/2016  . Constipation, chronic 12/12/2016  . Tinnitus, left 12/12/2016  . Dental caries 12/12/2016  . Tobacco abuse 12/12/2016    Outpatient Encounter Medications as of 01/04/2018  Medication Sig  . Cholecalciferol (D 2000) 2000 units TABS Take 2,000 Units by mouth daily.  . cyclobenzaprine (FLEXERIL) 5 MG tablet Take 1 tablet (5 mg total) by mouth 3 (three) times daily as needed for muscle spasms. May take 1 or 2 (Patient taking differently: Take 10 mg by mouth 3 (three) times daily as needed for muscle spasms. )  . estradiol (ESTRACE) 1 MG tablet Take 1 tablet (1 mg total) by mouth daily.  . IRON PO Take 2,000 mg by mouth daily.   .  Iron-Vitamins (GERITOL COMPLETE PO) Take 1 tablet by mouth daily.   Marland Kitchen linaclotide (LINZESS) 145 MCG CAPS capsule 1 PO 30 mins prior to OR WITH your first meal  . naproxen (NAPROSYN) 500 MG tablet Take 1 tablet (500 mg total) by mouth 2 (two) times daily with a meal. For neck pain  . polyethylene glycol powder (GLYCOLAX/MIRALAX) powder Take 17 g by mouth daily.  . ziprasidone (GEODON) 20 MG capsule Take 1 capsule (20 mg total) by mouth 2 (two) times daily with a meal.  . diazepam (VALIUM) 5 MG tablet Take 1 tablet (5 mg total) by mouth every 6 (six) hours as needed for anxiety.  . [EXPIRED] ketorolac (TORADOL) injection 60 mg   . [EXPIRED] methylPREDNISolone acetate (DEPO-MEDROL) injection 80 mg    No facility-administered encounter medications on file as of 01/04/2018.     No Known Allergies  Review of Systems  Constitutional: Negative for fatigue and unexpected weight change.  HENT: Positive for dental problem.   Eyes: Negative for photophobia and visual disturbance.  Respiratory: Negative for cough and shortness of breath.   Cardiovascular: Negative for chest pain and leg swelling.  Gastrointestinal: Positive for blood in stool and constipation. Negative for abdominal distention and abdominal pain.       Rectal bleeding persists after hemorrhoid banding-chronic  Genitourinary: Positive for flank pain. Negative for difficulty urinating, dyspareunia, dysuria, hematuria and vaginal discharge.  Urine has malodor  Musculoskeletal: Positive for back pain, neck pain and neck stiffness. Negative for arthralgias.       Pain in left neck and shoulder  Neurological: Negative for dizziness and headaches.  Psychiatric/Behavioral: Positive for behavioral problems. Negative for dysphoric mood. The patient is not nervous/anxious.        Under care of psychiatry    BP 108/78 (BP Location: Left Arm, Patient Position: Sitting, Cuff Size: Normal)   Pulse 76   Temp 98 F (36.7 C) (Temporal)    Resp 18   Ht 5\' 2"  (1.575 m)   Wt 198 lb 1.9 oz (89.9 kg)   SpO2 97%   BMI 36.24 kg/m   Physical Exam  Constitutional: She is oriented to person, place, and time. She appears well-developed and well-nourished. She appears distressed.  Dramatically bent over, taking small steps, appears uncomfortable  HENT:  Head: Normocephalic and atraumatic.  Mouth/Throat: Oropharynx is clear and moist.  Eyes: Conjunctivae are normal. Pupils are equal, round, and reactive to light.  Neck: Normal range of motion. Neck supple. No thyromegaly present.  Cardiovascular: Normal rate, regular rhythm and normal heart sounds.  Pulmonary/Chest: Effort normal and breath sounds normal. No respiratory distress.  Abdominal: Soft. Bowel sounds are normal.  Musculoskeletal: She exhibits no edema.       Back:  Lymphadenopathy:    She has no cervical adenopathy.  Neurological: She is alert and oriented to person, place, and time.  Strength sensation range of motion reflexes are normal in both lower extremities.  Skin: Skin is warm and dry.  Psychiatric: She has a normal mood and affect. Her behavior is normal. Thought content normal.  Nursing note and vitals reviewed.   ASSESSMENT/PLAN:  1. Back muscle spasm  - ketorolac (TORADOL) injection 60 mg - methylPREDNISolone acetate (DEPO-MEDROL) injection 80 mg  2. Acute right-sided thoracic back pain  - ketorolac (TORADOL) injection 60 mg - methylPREDNISolone acetate (DEPO-MEDROL) injection 80 mg  3. Malodorous urine  - POCT urinalysis dipstick - Urine Culture - Urine cytology ancillary only   Patient Instructions  Rest Ice to area 20 min every couple of hours Take the diazepam ( valium) for muscle relaxer This may cause drowsiness This will not be refilled You have received a shot for pain and a shot for inflammation Call if not better by Monday  You do not need to keep appointment on Monday for follow up   Raylene Everts, MD

## 2018-01-04 NOTE — Telephone Encounter (Signed)
ordered

## 2018-01-04 NOTE — Telephone Encounter (Signed)
Dr Modesta Messing  Patient called in requesting refill on Geodon. Next appointment 01/29/18

## 2018-01-04 NOTE — Patient Instructions (Addendum)
Rest Ice to area 20 min every couple of hours Take the diazepam ( valium) for muscle relaxer This may cause drowsiness This will not be refilled You have received a shot for pain and a shot for inflammation Call if not better by Monday  You do not need to keep appointment on Monday for follow up

## 2018-01-05 LAB — URINE CULTURE
MICRO NUMBER:: 90230394
Result:: NO GROWTH
SPECIMEN QUALITY:: ADEQUATE

## 2018-01-05 LAB — URINE CYTOLOGY ANCILLARY ONLY
CHLAMYDIA, DNA PROBE: NEGATIVE
NEISSERIA GONORRHEA: NEGATIVE
TRICH (WINDOWPATH): NEGATIVE

## 2018-01-08 ENCOUNTER — Encounter: Payer: Self-pay | Admitting: Family Medicine

## 2018-01-08 ENCOUNTER — Ambulatory Visit (INDEPENDENT_AMBULATORY_CARE_PROVIDER_SITE_OTHER): Payer: Medicare Other | Admitting: Family Medicine

## 2018-01-08 ENCOUNTER — Telehealth (HOSPITAL_COMMUNITY): Payer: Self-pay | Admitting: Psychiatry

## 2018-01-08 ENCOUNTER — Other Ambulatory Visit: Payer: Self-pay

## 2018-01-08 VITALS — BP 110/74 | HR 94 | Temp 98.0°F | Resp 20 | Ht 62.0 in | Wt 200.0 lb

## 2018-01-08 DIAGNOSIS — M62830 Muscle spasm of back: Secondary | ICD-10-CM

## 2018-01-08 DIAGNOSIS — K439 Ventral hernia without obstruction or gangrene: Secondary | ICD-10-CM | POA: Diagnosis not present

## 2018-01-08 DIAGNOSIS — L271 Localized skin eruption due to drugs and medicaments taken internally: Secondary | ICD-10-CM

## 2018-01-08 NOTE — Telephone Encounter (Signed)
Patient presented to the office. She started to have rash after taking geodon since last week.   There is a macular rash on her chest. She is advised to discontinue geodon if she has worsening rash. She is advised to contact the office if any worsening in her symptoms.

## 2018-01-08 NOTE — Progress Notes (Signed)
Chief Complaint  Patient presents with  . Follow-up    back pain   Patient is here today for follow-up.  She was seen 3 weeks ago for a thoracic muscle strain.  She was given medicine.  She has not yet started physical therapy.  She is mostly improved but still feels a little bit stiff.  She starts physical therapy next week.  She was compliant with treatment.  The medicine made her drowsy. Otherwise she was started on Geodon by her psychiatrist.  She finally got this filled and started taking it on Friday.  Onset Saturday she noticed an itchy rash on her chest.  Now she is turned itchy on her arms.  I told her that it could be the Geodon.  She needs to let her psychiatrist know, and not stop the medicine without consultation. She mentions today that her hernia is been bothering her.  This was discussed with her by her gynecologist last summer.  She thought she was being referred to a Psychologist, sport and exercise.  She would like to see someone to think about getting it fixed.  Sometimes is quite large and painful.  It is minimally symptomatic today.  No fever or chills.  No nausea vomiting.  No weight change.  No difficulty with bowels  Patient Active Problem List   Diagnosis Date Noted  . Finger swelling 12/18/2017  . Noncompliance with medication regimen 12/18/2017  . Melanosis coli 08/11/2017  . Personal history of colonic polyps   . Bipolar disorder, current episode mixed, moderate ([place]) 07/24/2017  . Rectal bleeding 07/11/2017  . H/O estrogen therapy 07/11/2017  . Vitamin D deficiency 03/20/2017  . Drug-seeking behavior 01/03/2017  . Marijuana use, episodic 01/03/2017  . Tinea manuum 12/19/2016  . Tinea pedis of both feet 12/19/2016  . Hernia of anterior abdominal wall 12/19/2016  . Constipation, chronic 12/12/2016  . Tinnitus, left 12/12/2016  . Dental caries 12/12/2016  . Tobacco abuse 12/12/2016    Outpatient Encounter Medications as of 01/08/2018  Medication Sig  . Cholecalciferol (D 2000)  2000 units TABS Take 2,000 Units by mouth daily.  . cyclobenzaprine (FLEXERIL) 5 MG tablet Take 1 tablet (5 mg total) by mouth 3 (three) times daily as needed for muscle spasms. May take 1 or 2 (Patient taking differently: Take 10 mg by mouth 3 (three) times daily as needed for muscle spasms. )  . diazepam (VALIUM) 5 MG tablet Take 1 tablet (5 mg total) by mouth every 6 (six) hours as needed for anxiety.  Marland Kitchen estradiol (ESTRACE) 1 MG tablet Take 1 tablet (1 mg total) by mouth daily.  . IRON PO Take 2,000 mg by mouth daily.   . Iron-Vitamins (GERITOL COMPLETE PO) Take 1 tablet by mouth daily.   Marland Kitchen linaclotide (LINZESS) 145 MCG CAPS capsule 1 PO 30 mins prior to OR WITH your first meal  . naproxen (NAPROSYN) 500 MG tablet Take 1 tablet (500 mg total) by mouth 2 (two) times daily with a meal. For neck pain  . polyethylene glycol powder (GLYCOLAX/MIRALAX) powder Take 17 g by mouth daily.  . ziprasidone (GEODON) 20 MG capsule Take 1 capsule (20 mg total) by mouth 2 (two) times daily with a meal.  . [DISCONTINUED] ziprasidone (GEODON) 20 MG capsule Take 1 capsule (20 mg total) by mouth 2 (two) times daily with a meal.   No facility-administered encounter medications on file as of 01/08/2018.     No Known Allergies  Review of Systems  Constitutional: Negative for  fatigue and unexpected weight change.  HENT: Positive for dental problem.   Eyes: Negative for photophobia and visual disturbance.  Respiratory: Negative for cough and shortness of breath.   Cardiovascular: Negative for chest pain and leg swelling.  Gastrointestinal: Positive for abdominal distention and abdominal pain. Negative for blood in stool, constipation and diarrhea.  Genitourinary: Negative for difficulty urinating, dyspareunia and vaginal discharge.       States that urine has odor.  Urinalysis and culture at last visit were negative  Musculoskeletal: Positive for back pain. Negative for arthralgias.       Improved  Neurological:  Negative for dizziness and headaches.  Psychiatric/Behavioral: Positive for behavioral problems and dysphoric mood. The patient is nervous/anxious.        Intermittent.  Under care of psychiatry    BP 110/74 (BP Location: Left Arm, Patient Position: Sitting, Cuff Size: Large)   Pulse 94   Temp 98 F (36.7 C) (Oral)   Resp 20   Ht 5\' 2"  (1.575 m)   Wt 200 lb (90.7 kg)   SpO2 97%   BMI 36.58 kg/m   Physical Exam  Constitutional: She is oriented to person, place, and time. She appears well-developed and well-nourished. No distress.  Appears tired  HENT:  Head: Normocephalic and atraumatic.  Right Ear: External ear normal.  Left Ear: External ear normal.  Mouth/Throat: Oropharynx is clear and moist.  Poor dentition  Eyes: EOM are normal. Pupils are equal, round, and reactive to light.  Neck: Normal range of motion. Neck supple. No thyromegaly present.  Cardiovascular: Normal rate, regular rhythm and normal heart sounds.  Pulmonary/Chest: Effort normal and breath sounds normal.  Abdominal: Soft. Bowel sounds are normal. She exhibits no distension. There is no tenderness.    Rounded appearance  Musculoskeletal: Normal range of motion. She exhibits edema.  Neurological: She is alert and oriented to person, place, and time.  Skin: Rash noted. No erythema.  Multiple 1-2 cm wheals scattered across anterior chest  Psychiatric: Her behavior is normal.  Difficult to direct.  Talkative    ASSESSMENT/PLAN:  1. Ventral hernia without obstruction or gangrene Minimally symptomatic.  Will refer for consultation - Ambulatory referral to General Surgery  2. Back muscle spasm Proving.  Yet to attend physical therapy  3. Drug eruption, fixed Rash that looks like small hives across the anterior chest.  No shortness of breath.  Reports to psychiatry   Patient Instructions  I am sending you to a surgeon for the umbilical hernia  Take a benadryl if the skin itches  Call Dr Modesta Messing and  tell her about the rash  We will send you a letter about follow up    Raylene Everts, MD

## 2018-01-08 NOTE — Patient Instructions (Signed)
I am sending you to a surgeon for the umbilical hernia  Take a benadryl if the skin itches  Call Dr Modesta Messing and tell her about the rash  We will send you a letter about follow up

## 2018-01-09 ENCOUNTER — Telehealth: Payer: Self-pay

## 2018-01-09 LAB — URINE CYTOLOGY ANCILLARY ONLY

## 2018-01-09 MED ORDER — METRONIDAZOLE 500 MG PO TABS: 500.0000 mg | ORAL_TABLET | Freq: Two times a day (BID) | ORAL | 0 refills | Status: AC

## 2018-01-09 NOTE — Telephone Encounter (Signed)
-----   Message from Raylene Everts, MD sent at 01/09/2018  3:59 PM EST ----- Needs Flagyl 500 mg twice daily times 7 days.  Cautioned no alcohol

## 2018-01-10 ENCOUNTER — Encounter: Payer: Self-pay | Admitting: Gastroenterology

## 2018-01-18 ENCOUNTER — Other Ambulatory Visit: Payer: Self-pay | Admitting: Family Medicine

## 2018-01-18 ENCOUNTER — Telehealth: Payer: Self-pay | Admitting: Family Medicine

## 2018-01-18 NOTE — Telephone Encounter (Signed)
Patient left message on nurse line stating she has been on medication for 'bacteria in my kidneys.' She states she has lost the medication and wants to know if another antibiotic can be called in.

## 2018-01-18 NOTE — Telephone Encounter (Signed)
Needs to come in for a visit and urinalysis

## 2018-01-18 NOTE — Telephone Encounter (Signed)
Please advise. Thank you

## 2018-01-19 NOTE — Telephone Encounter (Signed)
Patient returned call, patient will call back when she can get a ride here and make appointment.

## 2018-01-19 NOTE — Telephone Encounter (Signed)
Attempted to contact patient to notify her to come for a office visit. Patient did not answer and did not have a voicemail to leave message. Will try again

## 2018-01-22 ENCOUNTER — Encounter: Payer: Self-pay | Admitting: Family Medicine
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
## 2018-01-29 ENCOUNTER — Ambulatory Visit (INDEPENDENT_AMBULATORY_CARE_PROVIDER_SITE_OTHER): Payer: Medicare Other | Admitting: Psychiatry

## 2018-01-29 ENCOUNTER — Encounter (HOSPITAL_COMMUNITY): Payer: Self-pay | Admitting: Psychiatry

## 2018-01-29 VITALS — BP 149/98 | HR 92 | Ht 62.0 in | Wt 200.0 lb

## 2018-01-29 DIAGNOSIS — F17210 Nicotine dependence, cigarettes, uncomplicated: Secondary | ICD-10-CM | POA: Diagnosis not present

## 2018-01-29 DIAGNOSIS — G4700 Insomnia, unspecified: Secondary | ICD-10-CM

## 2018-01-29 DIAGNOSIS — Z818 Family history of other mental and behavioral disorders: Secondary | ICD-10-CM

## 2018-01-29 DIAGNOSIS — F1290 Cannabis use, unspecified, uncomplicated: Secondary | ICD-10-CM | POA: Diagnosis not present

## 2018-01-29 DIAGNOSIS — Z811 Family history of alcohol abuse and dependence: Secondary | ICD-10-CM | POA: Diagnosis not present

## 2018-01-29 DIAGNOSIS — Z736 Limitation of activities due to disability: Secondary | ICD-10-CM | POA: Diagnosis not present

## 2018-01-29 DIAGNOSIS — F3181 Bipolar II disorder: Secondary | ICD-10-CM | POA: Diagnosis not present

## 2018-01-29 MED ORDER — CARIPRAZINE HCL 1.5 MG PO CAPS: 1.5000 mg | ORAL_CAPSULE | Freq: Every day | ORAL | 0 refills | Status: DC

## 2018-01-29 NOTE — Patient Instructions (Signed)
1. Start Vraylar 1.5 mg daily (gave sample for amonth) 2. Return to clinic in two months for 30 mins

## 2018-02-06 ENCOUNTER — Telehealth (HOSPITAL_COMMUNITY): Payer: Self-pay | Admitting: *Deleted

## 2018-02-06 NOTE — Telephone Encounter (Signed)
Dr Modesta Messing Patient has called [facility] stating that she can't take that Vraylar you prescribed for her.  She says I need the doctor to call me # [phone]. States that she's not sleeping just   cat napping & constantly pacing back forth for no reason.

## 2018-02-06 NOTE — Telephone Encounter (Signed)
Advise her to discontinue vraylar. Given the past trials of medication, it would be the best to discuss options face to face. Ask her if she is willing to come back sooner (after her symptoms are resolved). Advise her to contact the office if her symptoms worsen despite discontinue her medication.

## 2018-02-08 ENCOUNTER — Ambulatory Visit: Payer: Medicare Other | Admitting: General Surgery

## 2018-02-28 DIAGNOSIS — K581 Irritable bowel syndrome with constipation: Secondary | ICD-10-CM | POA: Diagnosis not present

## 2018-02-28 DIAGNOSIS — M545 Low back pain: Secondary | ICD-10-CM | POA: Diagnosis not present

## 2018-02-28 DIAGNOSIS — R3912 Poor urinary stream: Secondary | ICD-10-CM | POA: Diagnosis not present

## 2018-02-28 DIAGNOSIS — M62830 Muscle spasm of back: Secondary | ICD-10-CM | POA: Diagnosis not present

## 2018-03-01 ENCOUNTER — Ambulatory Visit: Payer: Medicare Other | Admitting: General Surgery

## 2018-03-06 ENCOUNTER — Ambulatory Visit: Payer: Medicare Other | Admitting: General Surgery

## 2018-04-12 NOTE — Progress Notes (Deleted)
[place] MD/PA/NP OP Progress Note  04/12/2018 11:59 AM SOHANA AUSTELL  MRN:  [id number]  Chief Complaint:  HPI:   vraylar caused side effect  May try lamotrigine   Visit Diagnosis: No diagnosis found.  Past Psychiatric History: Please see initial evaluation for full details. I have reviewed the history. No updates at this time.     Past Medical History:  Past Medical History:  Diagnosis Date  . Anemia   . Anxiety   . Arthritis   . Bipolar 1 disorder ([place])   . Depression   . Fibrocystic breast disease   . History of cervical cancer   . History of kidney stones   . Hyperlipidemia   . Schizoaffective disorder ([place])   . Tobacco abuse 12/12/2016  . Vaginal Pap smear following hysterectomy for malignancy 07/11/2017  . Vaginal Pap smear, abnormal     Past Surgical History:  Procedure Laterality Date  . ABDOMINAL HYSTERECTOMY  1988   class 4 pap smear  . APPENDECTOMY  2015  . COLONOSCOPY WITH PROPOFOL N/A 08/08/2017   Procedure: COLONOSCOPY WITH PROPOFOL;  Surgeon: Danie Binder, MD;  [facility];  Service: Endoscopy;  Laterality: N/A;  1:45pm  . HEMORRHOID BANDING N/A 08/08/2017   Procedure: HEMORRHOID BANDING;  Surgeon: Danie Binder, MD;  [facility];  Service: Endoscopy;  Laterality: N/A;  . POLYPECTOMY  08/08/2017   Procedure: POLYPECTOMY;  Surgeon: Danie Binder, MD;  [facility];  Service: Endoscopy;;  colon  . SUBLINGUAL CYST EXCISION     right shoulder    Family Psychiatric History: Please see initial evaluation for full details. I have reviewed the history. No updates at this time.     Family History:  Family History  Problem Relation Age of Onset  . Hyperlipidemia Mother   . Hypertension Mother   . Liver cancer Father   . Alcohol abuse Father   . Mental illness Sister   . Mental illness Brother   . Colon cancer Neg Hx   . Colon polyps Neg Hx     Social History:  Social History   Socioeconomic History  .  Marital status: Single    Spouse name: Not on file  . Number of children: 1  . Years of education: 73  . Highest education level: Not on file  Occupational History  . Occupation: disabled    Comment: mental  Social Needs  . Financial resource strain: Not on file  . Food insecurity:    Worry: Not on file    Inability: Not on file  . Transportation needs:    Medical: Not on file    Non-medical: Not on file  Tobacco Use  . Smoking status: Light Tobacco Smoker    Packs/day: 0.50    Years: 20.00    Pack years: 10.00    Types: Cigarettes  . Smokeless tobacco: Never Used  . Tobacco comment: one pack a week  Substance and Sexual Activity  . Alcohol use: Yes    Comment: social , 01-12-2017 per pt yes on the Weekends  . Drug use: Yes    Frequency: 1.0 times per week    Types: Marijuana    Comment: last used July 2018.  Marland Kitchen Sexual activity: Yes    Birth control/protection: Surgical    Comment: hysterectomy  Lifestyle  . Physical activity:    Days per week: Not on file    Minutes per session: Not on file  . Stress: Not  on file  Relationships  . Social connections:    Talks on phone: Not on file    Gets together: Not on file    Attends religious service: Not on file    Active member of club or organization: Not on file    Attends meetings of clubs or organizations: Not on file    Relationship status: Not on file  Other Topics Concern  . Not on file  Social History Narrative   Disabled mental illness   Lives alone    Allergies: No Known Allergies  Metabolic Disorder Labs: Lab Results  Component Value Date   HGBA1C 5.0 12/25/2017   MPG 97 12/25/2017   MPG 85 12/19/2016   No results found for: PROLACTIN Lab Results  Component Value Date   CHOL 152 12/19/2016   TRIG 57 12/19/2016   HDL 57 12/19/2016   CHOLHDL 2.7 12/19/2016   VLDL 11 12/19/2016   [place] 84 12/19/2016   No results found for: TSH  Therapeutic Level Labs: No results found for: LITHIUM Lab Results   Component Value Date   VALPROATE 68.5 08/03/2017   No components found for:  CBMZ  Current Medications: Current Outpatient Medications  Medication Sig Dispense Refill  . cariprazine (VRAYLAR) capsule Take 1 capsule (1.5 mg total) by mouth daily. 30 capsule 0  . Cholecalciferol (D 2000) 2000 units TABS Take 2,000 Units by mouth daily.    . cyclobenzaprine (FLEXERIL) 5 MG tablet Take 1 tablet (5 mg total) by mouth 3 (three) times daily as needed for muscle spasms. May take 1 or 2 (Patient taking differently: Take 10 mg by mouth 3 (three) times daily as needed for muscle spasms. ) 30 tablet 1  . estradiol (ESTRACE) 1 MG tablet Take 1 tablet (1 mg total) by mouth daily. 30 tablet 12  . IRON PO Take 2,000 mg by mouth daily.     . Iron-Vitamins (GERITOL COMPLETE PO) Take 1 tablet by mouth daily.     Marland Kitchen linaclotide (LINZESS) 145 MCG CAPS capsule 1 PO 30 mins prior to OR WITH your first meal 30 capsule 11  . naproxen (NAPROSYN) 500 MG tablet Take 1 tablet (500 mg total) by mouth 2 (two) times daily with a meal. For neck pain 60 tablet 0  . polyethylene glycol powder (GLYCOLAX/MIRALAX) powder Take 17 g by mouth daily. 3350 g 11   No current facility-administered medications for this visit.      Musculoskeletal: Strength & Muscle Tone: within normal limits Gait & Station: normal Patient leans: N/A  Psychiatric Specialty Exam: ROS  There were no vitals taken for this visit.There is no height or weight on file to calculate BMI.  General Appearance: Fairly Groomed  Eye Contact:  Good  Speech:  Clear and Coherent  Volume:  Normal  Mood:  {BHH MOOD:22306}  Affect:  {Affect (PAA):22687}  Thought Process:  Coherent  Orientation:  Full (Time, Place, and Person)  Thought Content: Logical   Suicidal Thoughts:  {ST/HT (PAA):22692}  Homicidal Thoughts:  {ST/HT (PAA):22692}  Memory:  Immediate;   Good  Judgement:  {Judgement (PAA):22694}  Insight:  {Insight (PAA):22695}  Psychomotor Activity:   Normal  Concentration:  Concentration: Good and Attention Span: Good  Recall:  Good  Fund of Knowledge: Good  Language: Good  Akathisia:  No  Handed:  Right  AIMS (if indicated): not done  Assets:  Communication Skills Desire for Improvement  ADL's:  Intact  Cognition: WNL  Sleep:  {BHH GOOD/FAIR/POOR:22877}   Screenings:  PHQ2-9     Office Visit from 12/18/2017 in [facility] Office Visit from 09/29/2017 in [facility] Office Visit from 06/29/2017 in [facility] Office Visit from 03/28/2017 in [facility] Office Visit from 12/12/2016 in [facility]  PHQ-2 Total Score  0  0  3  0  6  PHQ-9 Total Score  -  -  11  -  21       Assessment and Plan:  ZANYLA KLEBBA is a 52 y.o. year old female with a history of bipolar II disorder, marijuana use disorder in sustained remission, who presents for follow up appointment for No diagnosis found.  # Bipolar Ii disorder # r/o PTSD  Patient reports neurovegetative symptoms with some hypomanic features, irritability since the last appointment.  She could not tolerate Geodon due to drowsiness and a rash.  Will try frailer for mood dysregulation.  Noted that she has cluster B traits and negative appraisal of trauma, which appears to have significant impact on her mood. Although she will greatly benefit from DBT and anger management, she is unable to afford it. Will continue to discuss as needed.   # Marijuana use disorder She is at pre-contemplative stage for marijuana use and has very limited insight. Will continue motivational interview.   Plan 1. Start Vraylar 1.5 mg daily (gave sample for amonth) 2. Return to clinic in two months for 30 mins  The patient demonstrates the following risk factors for suicide: Chronic risk factors for suicide include: psychiatric disorder of bipolar disorderand substance use disorder. Acute risk factorsfor suicide include: family or marital  conflict, unemployment and social withdrawal/isolation. Protective factorsfor this patient include: hope for the future. Considering these factors, the overall suicide risk at this point appears to be low. Patient isappropriate for outpatient follow up.    Norman Clay, MD 04/12/2018, 11:59 AM

## 2018-04-16 ENCOUNTER — Ambulatory Visit (HOSPITAL_COMMUNITY): Payer: Medicare Other | Admitting: Psychiatry

## 2018-04-17 ENCOUNTER — Other Ambulatory Visit: Payer: Self-pay

## 2018-04-17 ENCOUNTER — Encounter (HOSPITAL_COMMUNITY): Payer: Self-pay | Admitting: Emergency Medicine

## 2018-04-17 ENCOUNTER — Emergency Department (HOSPITAL_COMMUNITY)
Admission: EM | Admit: 2018-04-17 | Discharge: 2018-04-17 | Disposition: A | Discharge status: Home / Self Care | Payer: Medicare Other | Attending: Emergency Medicine | Admitting: Emergency Medicine

## 2018-04-17 DIAGNOSIS — Y999 Unspecified external cause status: Secondary | ICD-10-CM | POA: Diagnosis not present

## 2018-04-17 DIAGNOSIS — S80262A Insect bite (nonvenomous), left knee, initial encounter: Secondary | ICD-10-CM | POA: Insufficient documentation

## 2018-04-17 DIAGNOSIS — Y929 Unspecified place or not applicable: Secondary | ICD-10-CM | POA: Diagnosis not present

## 2018-04-17 DIAGNOSIS — Z79899 Other long term (current) drug therapy: Secondary | ICD-10-CM | POA: Insufficient documentation

## 2018-04-17 DIAGNOSIS — F17210 Nicotine dependence, cigarettes, uncomplicated: Secondary | ICD-10-CM | POA: Insufficient documentation

## 2018-04-17 DIAGNOSIS — M791 Myalgia, unspecified site: Secondary | ICD-10-CM | POA: Diagnosis not present

## 2018-04-17 DIAGNOSIS — W57XXXA Bitten or stung by nonvenomous insect and other nonvenomous arthropods, initial encounter: Secondary | ICD-10-CM | POA: Insufficient documentation

## 2018-04-17 DIAGNOSIS — Y939 Activity, unspecified: Secondary | ICD-10-CM | POA: Diagnosis not present

## 2018-04-17 DIAGNOSIS — L819 Disorder of pigmentation, unspecified: Secondary | ICD-10-CM | POA: Diagnosis present

## 2018-04-17 DIAGNOSIS — L539 Erythematous condition, unspecified: Secondary | ICD-10-CM | POA: Diagnosis not present

## 2018-04-17 HISTORY — DX: Disorder of kidney and ureter, unspecified: N28.9

## 2018-04-17 MED ORDER — DOXYCYCLINE HYCLATE 100 MG PO CAPS: 100.0000 mg | ORAL_CAPSULE | Freq: Two times a day (BID) | ORAL | 0 refills | Status: DC

## 2018-04-17 MED ORDER — NAPROXEN 500 MG PO TABS: 500.0000 mg | ORAL_TABLET | Freq: Two times a day (BID) | ORAL | 0 refills | Status: DC

## 2018-04-17 NOTE — ED Triage Notes (Signed)
Pt states has tick on back of left leg for one week. Pt states tried to pull out and head stayed.

## 2018-04-17 NOTE — Discharge Instructions (Addendum)
Take the entire course of the antibiotic prescribed to cover for possible tick borne infections.

## 2018-04-19 ENCOUNTER — Other Ambulatory Visit: Payer: Self-pay

## 2018-04-19 NOTE — ED Provider Notes (Signed)
[facility] EMERGENCY DEPARTMENT Provider Note   CSN: [id number] Arrival date & time: 04/17/18  1830     History   Chief Complaint Chief Complaint  Patient presents with  . Tick Removal    HPI Regina Carson is a 52 y.o. female presenting for evaluation of tick bite to her left posterior knee.  She discovered the tick one week ago, states it was firmly attached and swollen.  She has noticed a dark spot at the site and is concerned for retained tick fragment.  She has had increased redness and soreness at the site as well. She denies fevers, chills, headache, rash or drainage from the site but does endorse some generalized body aches. No tx prior to arrival.  The history is provided by the patient.    Past Medical History:  Diagnosis Date  . Anemia   . Anxiety   . Arthritis   . Bipolar 1 disorder ([place])   . Depression   . Fibrocystic breast disease   . History of cervical cancer   . History of kidney stones   . Hyperlipidemia   . Renal disorder    kidney stones  . Schizoaffective disorder ([place])   . Tobacco abuse 12/12/2016  . Vaginal Pap smear following hysterectomy for malignancy 07/11/2017  . Vaginal Pap smear, abnormal     Patient Active Problem List   Diagnosis Date Noted  . Bipolar II disorder ([place]) 01/29/2018  . Finger swelling 12/18/2017  . Noncompliance with medication regimen 12/18/2017  . Melanosis coli 08/11/2017  . Personal history of colonic polyps   . Bipolar disorder, current episode mixed, moderate ([place]) 07/24/2017  . Rectal bleeding 07/11/2017  . H/O estrogen therapy 07/11/2017  . Vitamin D deficiency 03/20/2017  . Drug-seeking behavior 01/03/2017  . Marijuana use, episodic 01/03/2017  . Tinea manuum 12/19/2016  . Tinea pedis of both feet 12/19/2016  . Hernia of anterior abdominal wall 12/19/2016  . Constipation, chronic 12/12/2016  . Tinnitus, left 12/12/2016  . Dental caries 12/12/2016  . Tobacco abuse 12/12/2016    Past Surgical  History:  Procedure Laterality Date  . ABDOMINAL HYSTERECTOMY  1988   class 4 pap smear  . APPENDECTOMY  2015  . COLONOSCOPY WITH PROPOFOL N/A 08/08/2017   Procedure: COLONOSCOPY WITH PROPOFOL;  Surgeon: Danie Binder, MD;  [facility];  Service: Endoscopy;  Laterality: N/A;  1:45pm  . HEMORRHOID BANDING N/A 08/08/2017   Procedure: HEMORRHOID BANDING;  Surgeon: Danie Binder, MD;  [facility];  Service: Endoscopy;  Laterality: N/A;  . POLYPECTOMY  08/08/2017   Procedure: POLYPECTOMY;  Surgeon: Danie Binder, MD;  [facility];  Service: Endoscopy;;  colon  . SUBLINGUAL CYST EXCISION     right shoulder     OB History    Gravida  1   Para      Term      Preterm      AB      Living  1     SAB      TAB      Ectopic      Multiple      Live Births               Home Medications    Prior to Admission medications   Medication Sig Start Date End Date Taking? Authorizing Provider  cariprazine (VRAYLAR) capsule Take 1 capsule (1.5 mg total) by mouth daily. 01/29/18   Norman Clay, MD  Cholecalciferol (D 2000) 2000 units TABS Take 2,000 Units by mouth daily.    [provider]  cyclobenzaprine (FLEXERIL) 5 MG tablet Take 1 tablet (5 mg total) by mouth 3 (three) times daily as needed for muscle spasms. May take 1 or 2 Patient taking differently: Take 10 mg by mouth 3 (three) times daily as needed for muscle spasms.  06/29/17   Raylene Everts, MD  doxycycline (VIBRAMYCIN) 100 MG capsule Take 1 capsule (100 mg total) by mouth 2 (two) times daily. 04/17/18   Evalee Jefferson, PA-C  estradiol (ESTRACE) 1 MG tablet Take 1 tablet (1 mg total) by mouth daily. 07/11/17   Derrek Monaco A, NP  IRON PO Take 2,000 mg by mouth daily.     [provider]  Iron-Vitamins (GERITOL COMPLETE PO) Take 1 tablet by mouth daily.     [provider]  linaclotide Rolan Lipa) 145 MCG CAPS capsule 1 PO 30 mins prior to OR WITH your first  meal 08/08/17   Fields, Marga Melnick, MD  naproxen (NAPROSYN) 500 MG tablet Take 1 tablet (500 mg total) by mouth 2 (two) times daily. 04/17/18   Evalee Jefferson, PA-C  polyethylene glycol powder (GLYCOLAX/MIRALAX) powder Take 17 g by mouth daily. 03/28/17   Raylene Everts, MD    Family History Family History  Problem Relation Age of Onset  . Hyperlipidemia Mother   . Hypertension Mother   . Liver cancer Father   . Alcohol abuse Father   . Mental illness Sister   . Mental illness Brother   . Colon cancer Neg Hx   . Colon polyps Neg Hx     Social History Social History   Tobacco Use  . Smoking status: Light Tobacco Smoker    Packs/day: 0.50    Years: 20.00    Pack years: 10.00    Types: Cigarettes  . Smokeless tobacco: Never Used  . Tobacco comment: one pack a week  Substance Use Topics  . Alcohol use: Yes    Comment: social , 01-12-2017 per pt yes on the Weekends  . Drug use: Yes    Frequency: 1.0 times per week    Types: Marijuana     Allergies   Patient has no known allergies.   Review of Systems Review of Systems  Constitutional: Negative for chills and fever.  HENT: Negative.   Respiratory: Negative for shortness of breath and wheezing.   Gastrointestinal: Negative.  Negative for abdominal pain.  Musculoskeletal: Negative for arthralgias.  Skin: Positive for color change and wound.  Neurological: Negative for numbness.     Physical Exam Updated Vital Signs BP (!) 131/91 (BP Location: Right Arm)   Pulse 92   Temp 98.6 F (37 C) (Oral)   Resp 14   Ht 5\' 2"  (1.575 m)   Wt 86.2 kg (190 lb)   SpO2 100%   BMI 34.75 kg/m   Physical Exam  Constitutional: She appears well-developed and well-nourished. No distress.  HENT:  Head: Normocephalic.  Neck: Neck supple.  Cardiovascular: Normal rate.  Pulmonary/Chest: Effort normal. She has no wheezes.  Musculoskeletal: Normal range of motion. She exhibits no edema.  Lymphadenopathy:       Left: No inguinal  adenopathy present.  Skin: No rash noted. There is erythema.  3 cm area of erythema without edema or induration left posterior popliteal space.  No visible retained fb. No central clearing. No red streaking.     ED Treatments / Results  Labs (all labs ordered  are listed, but only abnormal results are displayed) Labs Reviewed - No data to display  EKG None  Radiology No results found.  Procedures Procedures (including critical care time)  Medications Ordered in ED Medications - No data to display   Initial Impression / Assessment and Plan / ED Course  I have reviewed the triage vital signs and the nursing notes.  Pertinent labs & imaging results that were available during my care of the patient were reviewed by me and considered in my medical decision making (see chart for details).     Pt with h/o attached tick with no visible retained fb, but with surrounding erythema at the bite site, no bullseye lesion.  Possible early cellulitis, will cover for tick borne infection and cellulitis with doxycycline. Advised close watch and f/u with pcp for new or worsened sx. Pt endorses has f/u appt next week with her pcp.  Final Clinical Impressions(s) / ED Diagnoses   Final diagnoses:  Tick bite, initial encounter    ED Discharge Orders        Ordered    doxycycline (VIBRAMYCIN) 100 MG capsule  2 times daily     04/17/18 1902    naproxen (NAPROSYN) 500 MG tablet  2 times daily     04/17/18 1906       Evalee Jefferson, Hershal Coria 04/19/18 1413    Mesner, Corene Cornea, MD 04/23/18 [phone]

## 2018-05-15 DIAGNOSIS — E119 Type 2 diabetes mellitus without complications: Secondary | ICD-10-CM | POA: Diagnosis not present

## 2018-05-21 ENCOUNTER — Ambulatory Visit (HOSPITAL_COMMUNITY): Payer: Medicare Other | Admitting: Psychiatry

## 2018-05-21 NOTE — Progress Notes (Deleted)
[facility] MD/PA/NP OP Progress Note  05/21/2018 9:12 AM Regina Carson  MRN:  [id number]  Chief Complaint:  HPI: *** Visit Diagnosis: No diagnosis found.  Past Psychiatric History: Please see initial evaluation for full details. I have reviewed the history. No updates at this time.     Past Medical History:  Past Medical History:  Diagnosis Date  . Anemia   . Anxiety   . Arthritis   . Bipolar 1 disorder ([place])   . Depression   . Fibrocystic breast disease   . History of cervical cancer   . History of kidney stones   . Hyperlipidemia   . Renal disorder    kidney stones  . Schizoaffective disorder ([place])   . Tobacco abuse 12/12/2016  . Vaginal Pap smear following hysterectomy for malignancy 07/11/2017  . Vaginal Pap smear, abnormal     Past Surgical History:  Procedure Laterality Date  . ABDOMINAL HYSTERECTOMY  1988   class 4 pap smear  . APPENDECTOMY  2015  . COLONOSCOPY WITH PROPOFOL N/A 08/08/2017   Procedure: COLONOSCOPY WITH PROPOFOL;  Surgeon: Danie Binder, MD;  [facility];  Service: Endoscopy;  Laterality: N/A;  1:45pm  . HEMORRHOID BANDING N/A 08/08/2017   Procedure: HEMORRHOID BANDING;  Surgeon: Danie Binder, MD;  [facility];  Service: Endoscopy;  Laterality: N/A;  . POLYPECTOMY  08/08/2017   Procedure: POLYPECTOMY;  Surgeon: Danie Binder, MD;  [facility];  Service: Endoscopy;;  colon  . SUBLINGUAL CYST EXCISION     right shoulder    Family Psychiatric History: Please see initial evaluation for full details. I have reviewed the history. No updates at this time.     Family History:  Family History  Problem Relation Age of Onset  . Hyperlipidemia Mother   . Hypertension Mother   . Liver cancer Father   . Alcohol abuse Father   . Mental illness Sister   . Mental illness Brother   . Colon cancer Neg Hx   . Colon polyps Neg Hx     Social History:  Social History   Socioeconomic History  . Marital status: Single     Spouse name: Not on file  . Number of children: 1  . Years of education: 74  . Highest education level: Not on file  Occupational History  . Occupation: disabled    Comment: mental  Social Needs  . Financial resource strain: Not on file  . Food insecurity:    Worry: Not on file    Inability: Not on file  . Transportation needs:    Medical: Not on file    Non-medical: Not on file  Tobacco Use  . Smoking status: Light Tobacco Smoker    Packs/day: 0.50    Years: 20.00    Pack years: 10.00    Types: Cigarettes  . Smokeless tobacco: Never Used  . Tobacco comment: one pack a week  Substance and Sexual Activity  . Alcohol use: Yes    Comment: social , 01-12-2017 per pt yes on the Weekends  . Drug use: Yes    Frequency: 1.0 times per week    Types: Marijuana  . Sexual activity: Yes    Birth control/protection: Surgical    Comment: hysterectomy  Lifestyle  . Physical activity:    Days per week: Not on file    Minutes per session: Not on file  . Stress: Not on file  Relationships  . Social connections:  Talks on phone: Not on file    Gets together: Not on file    Attends religious service: Not on file    Active member of club or organization: Not on file    Attends meetings of clubs or organizations: Not on file    Relationship status: Not on file  Other Topics Concern  . Not on file  Social History Narrative   Disabled mental illness   Lives alone    Allergies: No Known Allergies  Metabolic Disorder Labs: Lab Results  Component Value Date   HGBA1C 5.0 12/25/2017   MPG 97 12/25/2017   MPG 85 12/19/2016   No results found for: PROLACTIN Lab Results  Component Value Date   CHOL 152 12/19/2016   TRIG 57 12/19/2016   HDL 57 12/19/2016   CHOLHDL 2.7 12/19/2016   VLDL 11 12/19/2016   [place] 84 12/19/2016   No results found for: TSH  Therapeutic Level Labs: No results found for: LITHIUM Lab Results  Component Value Date   VALPROATE 68.5 08/03/2017    No components found for:  CBMZ  Current Medications: Current Outpatient Medications  Medication Sig Dispense Refill  . cariprazine (VRAYLAR) capsule Take 1 capsule (1.5 mg total) by mouth daily. 30 capsule 0  . Cholecalciferol (D 2000) 2000 units TABS Take 2,000 Units by mouth daily.    . cyclobenzaprine (FLEXERIL) 5 MG tablet Take 1 tablet (5 mg total) by mouth 3 (three) times daily as needed for muscle spasms. May take 1 or 2 (Patient taking differently: Take 10 mg by mouth 3 (three) times daily as needed for muscle spasms. ) 30 tablet 1  . doxycycline (VIBRAMYCIN) 100 MG capsule Take 1 capsule (100 mg total) by mouth 2 (two) times daily. 20 capsule 0  . estradiol (ESTRACE) 1 MG tablet Take 1 tablet (1 mg total) by mouth daily. 30 tablet 12  . IRON PO Take 2,000 mg by mouth daily.     . Iron-Vitamins (GERITOL COMPLETE PO) Take 1 tablet by mouth daily.     Marland Kitchen linaclotide (LINZESS) 145 MCG CAPS capsule 1 PO 30 mins prior to OR WITH your first meal 30 capsule 11  . naproxen (NAPROSYN) 500 MG tablet Take 1 tablet (500 mg total) by mouth 2 (two) times daily. 20 tablet 0  . polyethylene glycol powder (GLYCOLAX/MIRALAX) powder Take 17 g by mouth daily. 3350 g 11   No current facility-administered medications for this visit.      Musculoskeletal: Strength & Muscle Tone: within normal limits Gait & Station: normal Patient leans: N/A  Psychiatric Specialty Exam: ROS  There were no vitals taken for this visit.There is no height or weight on file to calculate BMI.  General Appearance: Fairly Groomed  Eye Contact:  Good  Speech:  Clear and Coherent  Volume:  Normal  Mood:  {BHH MOOD:22306}  Affect:  {Affect (PAA):22687}  Thought Process:  Coherent  Orientation:  Full (Time, Place, and Person)  Thought Content: Logical   Suicidal Thoughts:  {ST/HT (PAA):22692}  Homicidal Thoughts:  {ST/HT (PAA):22692}  Memory:  Immediate;   Good  Judgement:  {Judgement (PAA):22694}  Insight:  {Insight  (PAA):22695}  Psychomotor Activity:  Normal  Concentration:  Concentration: Good and Attention Span: Good  Recall:  Good  Fund of Knowledge: Good  Language: Good  Akathisia:  No  Handed:  Right  AIMS (if indicated): not done  Assets:  Communication Skills Desire for Improvement  ADL's:  Intact  Cognition: WNL  Sleep:  {  [place] GOOD/FAIR/POOR:22877}   Screenings: PHQ2-9     Office Visit from 12/18/2017 in [facility] Office Visit from 09/29/2017 in [facility] Office Visit from 06/29/2017 in [facility] Office Visit from 03/28/2017 in [facility] Office Visit from 12/12/2016 in [facility]  PHQ-2 Total Score  0  0  3  0  6  PHQ-9 Total Score  -  -  11  -  21       Assessment and Plan:  Regina Carson is a 52 y.o. year old female with a history of bipolar II disorder, marijuana use disorder in sustained remission, who presents for follow up appointment for No diagnosis found.  # Bipolar II disorder # r/o PTSD  Patient reports neurovegetative symptoms with some hypomanic features, irritability since the last appointment.  She could not tolerate Geodon due to drowsiness and a rash.  Will try frailer for mood dysregulation.  Noted that she has cluster B traits and negative appraisal of trauma, which appears to have significant impact on her mood. Although she will greatly benefit from DBT and anger management, she is unable to afford it. Will continue to discuss as needed.   # Marijuana use disorder She is at pre-contemplative stage for marijuana use and has very limited insight. Will continue motivational interview.   Plan 1. Start Vraylar 1.5 mg daily (gave sample for amonth) 2. Return to clinic in two months for 30 mins  Past trials of medication: citalopram, Risperdal, Seroquel,vraylar, Abilify, Geodon, Depakote (rash), Trazodone,Xanax, clonazepam   The patient demonstrates the following risk factors for suicide:  Chronic risk factors for suicide include: psychiatric disorder of bipolar disorderand substance use disorder. Acute risk factorsfor suicide include: family or marital conflict, unemployment and social withdrawal/isolation. Protective factorsfor this patient include: hope for the future. Considering these factors, the overall suicide risk at this point appears to be low. Patient isappropriate for outpatient follow up.    Norman Clay, MD 05/21/2018, 9:12 AM

## 2018-05-31 DIAGNOSIS — L03211 Cellulitis of face: Secondary | ICD-10-CM | POA: Diagnosis not present

## 2018-05-31 DIAGNOSIS — N951 Menopausal and female climacteric states: Secondary | ICD-10-CM | POA: Diagnosis not present

## 2018-07-14 DIAGNOSIS — Z79899 Other long term (current) drug therapy: Secondary | ICD-10-CM | POA: Diagnosis not present

## 2018-07-14 DIAGNOSIS — M545 Low back pain: Secondary | ICD-10-CM | POA: Diagnosis not present

## 2018-07-14 DIAGNOSIS — Z9089 Acquired absence of other organs: Secondary | ICD-10-CM | POA: Diagnosis not present

## 2018-07-14 DIAGNOSIS — S0101XA Laceration without foreign body of scalp, initial encounter: Secondary | ICD-10-CM | POA: Diagnosis not present

## 2018-07-14 DIAGNOSIS — S134XXA Sprain of ligaments of cervical spine, initial encounter: Secondary | ICD-10-CM | POA: Diagnosis not present

## 2018-07-14 DIAGNOSIS — R5381 Other malaise: Secondary | ICD-10-CM | POA: Diagnosis not present

## 2018-07-14 DIAGNOSIS — S0291XA Unspecified fracture of skull, initial encounter for closed fracture: Secondary | ICD-10-CM | POA: Diagnosis not present

## 2018-07-14 DIAGNOSIS — M546 Pain in thoracic spine: Secondary | ICD-10-CM | POA: Diagnosis not present

## 2018-07-14 DIAGNOSIS — S0183XA Puncture wound without foreign body of other part of head, initial encounter: Secondary | ICD-10-CM | POA: Diagnosis not present

## 2018-07-14 DIAGNOSIS — S335XXA Sprain of ligaments of lumbar spine, initial encounter: Secondary | ICD-10-CM | POA: Diagnosis not present

## 2018-07-14 DIAGNOSIS — S1980XA Other specified injuries of unspecified part of neck, initial encounter: Secondary | ICD-10-CM | POA: Diagnosis not present

## 2018-07-14 DIAGNOSIS — S098XXA Other specified injuries of head, initial encounter: Secondary | ICD-10-CM | POA: Diagnosis not present

## 2018-07-14 DIAGNOSIS — R069 Unspecified abnormalities of breathing: Secondary | ICD-10-CM | POA: Diagnosis not present

## 2018-07-14 DIAGNOSIS — R52 Pain, unspecified: Secondary | ICD-10-CM | POA: Diagnosis not present

## 2018-07-14 DIAGNOSIS — S233XXA Sprain of ligaments of thoracic spine, initial encounter: Secondary | ICD-10-CM | POA: Diagnosis not present

## 2018-07-14 DIAGNOSIS — S01112A Laceration without foreign body of left eyelid and periocular area, initial encounter: Secondary | ICD-10-CM | POA: Diagnosis not present

## 2018-07-14 DIAGNOSIS — R079 Chest pain, unspecified: Secondary | ICD-10-CM | POA: Diagnosis not present

## 2018-07-19 ENCOUNTER — Other Ambulatory Visit: Payer: Self-pay | Admitting: Adult Health

## 2018-07-26 DIAGNOSIS — S0101XD Laceration without foreign body of scalp, subsequent encounter: Secondary | ICD-10-CM | POA: Diagnosis not present

## 2018-07-26 DIAGNOSIS — Z9089 Acquired absence of other organs: Secondary | ICD-10-CM | POA: Diagnosis not present

## 2018-07-26 DIAGNOSIS — Z4802 Encounter for removal of sutures: Secondary | ICD-10-CM | POA: Diagnosis not present

## 2018-07-26 DIAGNOSIS — S0181XD Laceration without foreign body of other part of head, subsequent encounter: Secondary | ICD-10-CM | POA: Diagnosis not present

## 2018-08-09 DIAGNOSIS — R3912 Poor urinary stream: Secondary | ICD-10-CM | POA: Diagnosis not present

## 2018-08-09 DIAGNOSIS — K581 Irritable bowel syndrome with constipation: Secondary | ICD-10-CM | POA: Diagnosis not present

## 2018-08-09 DIAGNOSIS — Z0000 Encounter for general adult medical examination without abnormal findings: Secondary | ICD-10-CM | POA: Diagnosis not present

## 2018-08-09 DIAGNOSIS — N63 Unspecified lump in unspecified breast: Secondary | ICD-10-CM | POA: Diagnosis not present

## 2018-08-09 DIAGNOSIS — M545 Low back pain: Secondary | ICD-10-CM | POA: Diagnosis not present

## 2018-08-09 DIAGNOSIS — N390 Urinary tract infection, site not specified: Secondary | ICD-10-CM | POA: Diagnosis not present

## 2018-08-09 DIAGNOSIS — M62830 Muscle spasm of back: Secondary | ICD-10-CM | POA: Diagnosis not present

## 2018-08-09 DIAGNOSIS — N951 Menopausal and female climacteric states: Secondary | ICD-10-CM | POA: Diagnosis not present

## 2018-08-15 DIAGNOSIS — N6310 Unspecified lump in the right breast, unspecified quadrant: Secondary | ICD-10-CM | POA: Diagnosis not present

## 2018-08-16 ENCOUNTER — Other Ambulatory Visit (HOSPITAL_COMMUNITY): Payer: Self-pay | Admitting: Internal Medicine

## 2018-08-16 DIAGNOSIS — N6310 Unspecified lump in the right breast, unspecified quadrant: Secondary | ICD-10-CM

## 2018-08-16 DIAGNOSIS — N63 Unspecified lump in unspecified breast: Secondary | ICD-10-CM

## 2018-08-21 ENCOUNTER — Ambulatory Visit (HOSPITAL_COMMUNITY): Payer: Medicare Other

## 2018-08-21 ENCOUNTER — Ambulatory Visit (HOSPITAL_COMMUNITY)
Admission: RE | Admit: 2018-08-21 | Discharge: 2018-08-21 | Disposition: A | Discharge status: Home / Self Care | Payer: Medicare Other | Source: Ambulatory Visit | Attending: Internal Medicine | Admitting: Internal Medicine

## 2018-08-21 DIAGNOSIS — N6001 Solitary cyst of right breast: Secondary | ICD-10-CM | POA: Diagnosis not present

## 2018-08-21 DIAGNOSIS — N6310 Unspecified lump in the right breast, unspecified quadrant: Secondary | ICD-10-CM | POA: Diagnosis not present

## 2018-08-21 DIAGNOSIS — R922 Inconclusive mammogram: Secondary | ICD-10-CM | POA: Diagnosis not present

## 2018-08-21 DIAGNOSIS — N63 Unspecified lump in unspecified breast: Secondary | ICD-10-CM

## 2018-08-23 ENCOUNTER — Other Ambulatory Visit: Payer: Self-pay | Admitting: Adult Health Nurse Practitioner

## 2018-08-23 DIAGNOSIS — N6001 Solitary cyst of right breast: Secondary | ICD-10-CM

## 2018-08-28 DIAGNOSIS — M545 Low back pain: Secondary | ICD-10-CM | POA: Diagnosis not present

## 2018-08-28 DIAGNOSIS — R202 Paresthesia of skin: Secondary | ICD-10-CM | POA: Diagnosis not present

## 2018-08-28 DIAGNOSIS — D241 Benign neoplasm of right breast: Secondary | ICD-10-CM | POA: Diagnosis not present

## 2018-09-06 ENCOUNTER — Other Ambulatory Visit: Payer: Self-pay | Admitting: Adult Health Nurse Practitioner

## 2018-09-06 ENCOUNTER — Ambulatory Visit
Admission: RE | Admit: 2018-09-06 | Discharge: 2018-09-06 | Disposition: A | Discharge status: Home / Self Care | Payer: Medicare Other | Source: Ambulatory Visit | Attending: Adult Health Nurse Practitioner | Admitting: Adult Health Nurse Practitioner

## 2018-09-06 DIAGNOSIS — N6310 Unspecified lump in the right breast, unspecified quadrant: Secondary | ICD-10-CM

## 2018-09-06 DIAGNOSIS — N6311 Unspecified lump in the right breast, upper outer quadrant: Secondary | ICD-10-CM | POA: Diagnosis not present

## 2018-09-06 DIAGNOSIS — N6001 Solitary cyst of right breast: Secondary | ICD-10-CM

## 2018-09-06 DIAGNOSIS — N6011 Diffuse cystic mastopathy of right breast: Secondary | ICD-10-CM | POA: Diagnosis not present

## 2018-09-28 DIAGNOSIS — M545 Low back pain: Secondary | ICD-10-CM | POA: Diagnosis not present

## 2018-09-28 DIAGNOSIS — R202 Paresthesia of skin: Secondary | ICD-10-CM | POA: Diagnosis not present

## 2018-10-03 ENCOUNTER — Other Ambulatory Visit: Payer: Medicare Other | Admitting: Adult Health

## 2018-10-21 ENCOUNTER — Other Ambulatory Visit: Payer: Self-pay | Admitting: Adult Health

## 2018-11-28 ENCOUNTER — Other Ambulatory Visit: Payer: Medicare Other | Admitting: Adult Health

## 2018-12-12 NOTE — Congregational Nurse Program (Signed)
  Dept: [phone]   Congregational Nurse Program Note  Date of Encounter: 12/12/2018  Past Medical History: Past Medical History:  Diagnosis Date  . Anemia   . Anxiety   . Arthritis   . Bipolar 1 disorder ([place])   . Depression   . Fibrocystic breast disease   . History of cervical cancer   . History of kidney stones   . Hyperlipidemia   . Renal disorder    kidney stones  . Schizoaffective disorder ([place])   . Tobacco abuse 12/12/2016  . Vaginal Pap smear following hysterectomy for malignancy 07/11/2017  . Vaginal Pap smear, abnormal     Encounter Details: CNP Questionnaire - 12/12/18 1014      Questionnaire   Patient Status  Not Applicable    Race  Black or African American    Location Patient [place]  Medicare    Uninsured  Not Applicable    Food  Yes, have food insecurities    Housing/Utilities  Yes, have permanent housing    Transportation  No transportation needs    Interpersonal Safety  Yes, feel physically and emotionally safe where you currently live    Medication  No medication insecurities    Medical Provider  Yes    Referrals  Not Applicable    ED Visit Averted  Not Applicable    Life-Saving Intervention Made  Not Applicable     Client requesting bp check today. Complaints of headache several days with nausea/vomiting.  Encouraged client to call pcp for appointment today to be checked out. [place] states will call. Tarri Fuller [phone].

## 2018-12-28 DIAGNOSIS — R945 Abnormal results of liver function studies: Secondary | ICD-10-CM | POA: Diagnosis not present

## 2018-12-28 DIAGNOSIS — R944 Abnormal results of kidney function studies: Secondary | ICD-10-CM | POA: Diagnosis not present

## 2018-12-28 DIAGNOSIS — K469 Unspecified abdominal hernia without obstruction or gangrene: Secondary | ICD-10-CM | POA: Diagnosis not present

## 2018-12-28 DIAGNOSIS — M25511 Pain in right shoulder: Secondary | ICD-10-CM | POA: Diagnosis not present

## 2018-12-28 DIAGNOSIS — M545 Low back pain: Secondary | ICD-10-CM | POA: Diagnosis not present

## 2019-01-03 ENCOUNTER — Other Ambulatory Visit: Payer: Medicare Other | Admitting: Adult Health

## 2019-01-14 ENCOUNTER — Other Ambulatory Visit: Payer: Medicare Other | Admitting: Adult Health

## 2019-04-05 DIAGNOSIS — K581 Irritable bowel syndrome with constipation: Secondary | ICD-10-CM | POA: Diagnosis not present

## 2019-04-05 DIAGNOSIS — M62830 Muscle spasm of back: Secondary | ICD-10-CM | POA: Diagnosis not present

## 2019-04-05 DIAGNOSIS — R3912 Poor urinary stream: Secondary | ICD-10-CM | POA: Diagnosis not present

## 2019-04-05 DIAGNOSIS — M545 Low back pain: Secondary | ICD-10-CM | POA: Diagnosis not present

## 2019-04-12 DIAGNOSIS — Z79891 Long term (current) use of opiate analgesic: Secondary | ICD-10-CM | POA: Diagnosis not present

## 2019-04-12 DIAGNOSIS — M545 Low back pain: Secondary | ICD-10-CM | POA: Diagnosis not present

## 2019-04-12 DIAGNOSIS — G8929 Other chronic pain: Secondary | ICD-10-CM | POA: Diagnosis not present

## 2019-04-12 DIAGNOSIS — R945 Abnormal results of liver function studies: Secondary | ICD-10-CM | POA: Diagnosis not present

## 2019-04-14 ENCOUNTER — Encounter (HOSPITAL_COMMUNITY): Payer: Self-pay | Admitting: *Deleted

## 2019-04-14 ENCOUNTER — Other Ambulatory Visit: Payer: Self-pay

## 2019-04-14 ENCOUNTER — Emergency Department (HOSPITAL_COMMUNITY)
Admission: EM | Admit: 2019-04-14 | Discharge: 2019-04-14 | Disposition: A | Discharge status: Home / Self Care | Payer: Medicare Other | Attending: Emergency Medicine | Admitting: Emergency Medicine

## 2019-04-14 DIAGNOSIS — N390 Urinary tract infection, site not specified: Secondary | ICD-10-CM | POA: Diagnosis not present

## 2019-04-14 DIAGNOSIS — Z720 Tobacco use: Secondary | ICD-10-CM | POA: Diagnosis not present

## 2019-04-14 DIAGNOSIS — Z79899 Other long term (current) drug therapy: Secondary | ICD-10-CM | POA: Diagnosis not present

## 2019-04-14 DIAGNOSIS — R300 Dysuria: Secondary | ICD-10-CM | POA: Diagnosis present

## 2019-04-14 HISTORY — DX: Other seasonal allergic rhinitis: J30.2

## 2019-04-14 LAB — URINALYSIS, ROUTINE W REFLEX MICROSCOPIC
Bilirubin Urine: NEGATIVE
Glucose, UA: NEGATIVE mg/dL
Hgb urine dipstick: NEGATIVE
Ketones, ur: NEGATIVE mg/dL
Nitrite: NEGATIVE
Protein, ur: NEGATIVE mg/dL
RBC / HPF: >50 RBC/hpf — ABNORMAL HIGH (ref 0–5)
Specific Gravity, Urine: 1.018 (ref 1.005–1.030)
WBC, UA: >50 WBC/hpf — ABNORMAL HIGH (ref 0–5)
pH: 5 (ref 5.0–8.0)

## 2019-04-14 MED ORDER — CEPHALEXIN 500 MG PO CAPS: 500.0000 mg | ORAL_CAPSULE | Freq: Four times a day (QID) | ORAL | 0 refills | Status: DC

## 2019-04-14 MED ORDER — CEPHALEXIN 500 MG PO CAPS
1000.0000 mg | ORAL_CAPSULE | Freq: Once | ORAL | Status: AC
  Administered 2019-04-14: 1000 mg via ORAL
  Filled 2019-04-14: qty 2

## 2019-04-14 NOTE — ED Triage Notes (Signed)
Pt with burning with urination for 2 weeks, also c/o feeling something "fell in her vagina". Denies hx prolapse uterus.

## 2019-04-14 NOTE — Discharge Instructions (Addendum)
It was our pleasure to provide your ER care today - we hope that you feel better.  Drink plenty of fluids.  Take keflex (antibiotic) as prescribed.   Follow up with primary care doctor in 2-3 days if symptoms fail to improve/resolve.  Return to ER if worse, new symptoms, fevers, persistent vomiting, severe abdominal or flank pain, other concern.

## 2019-04-14 NOTE — ED Provider Notes (Signed)
[facility] EMERGENCY DEPARTMENT Provider Note   CSN: [id number] Arrival date & time: 04/14/19  2012    History   Chief Complaint Chief Complaint  Patient presents with  . Dysuria    HPI Regina Carson is a 53 y.o. female.     Patient c/o urinary urgency, dysuria, for the past 1-2 weeks. Symptoms acute onset, moderate, constant, persistent. Denies vaginal bleeding or discharge. Remote hx hysterectomy. Denies recent uti, or any recent antibiotic treatment. No abdominal or flank pain. No fever or chills. Normal appetite.   The history is provided by the patient.  Dysuria  Associated symptoms: no abdominal pain, no fever, no flank pain and no vomiting     Past Medical History:  Diagnosis Date  . Anemia   . Anxiety   . Arthritis   . Bipolar 1 disorder ([place])   . Depression   . Fibrocystic breast disease   . History of cervical cancer   . History of kidney stones   . Hyperlipidemia   . Renal disorder    kidney stones  . Schizoaffective disorder ([place])   . Seasonal allergies   . Tobacco abuse 12/12/2016  . Vaginal Pap smear following hysterectomy for malignancy 07/11/2017  . Vaginal Pap smear, abnormal     Patient Active Problem List   Diagnosis Date Noted  . Bipolar II disorder ([place]) 01/29/2018  . Finger swelling 12/18/2017  . Noncompliance with medication regimen 12/18/2017  . Melanosis coli 08/11/2017  . Personal history of colonic polyps   . Bipolar disorder, current episode mixed, moderate ([place]) 07/24/2017  . Rectal bleeding 07/11/2017  . H/O estrogen therapy 07/11/2017  . Vitamin D deficiency 03/20/2017  . Drug-seeking behavior 01/03/2017  . Marijuana use, episodic 01/03/2017  . Tinea manuum 12/19/2016  . Tinea pedis of both feet 12/19/2016  . Hernia of anterior abdominal wall 12/19/2016  . Constipation, chronic 12/12/2016  . Tinnitus, left 12/12/2016  . Dental caries 12/12/2016  . Tobacco abuse 12/12/2016    Past Surgical History:  Procedure  Laterality Date  . ABDOMINAL HYSTERECTOMY  1988   class 4 pap smear  . APPENDECTOMY  2015  . COLONOSCOPY WITH PROPOFOL N/A 08/08/2017   Procedure: COLONOSCOPY WITH PROPOFOL;  Surgeon: Danie Binder, MD;  Location: [facility] ENDO [facility];  Service: Endoscopy;  Laterality: N/A;  1:45pm  . HEMORRHOID BANDING N/A 08/08/2017   Procedure: HEMORRHOID BANDING;  Surgeon: Danie Binder, MD;  Location: [facility] ENDO SU[facility];  Service: Endoscopy;  Laterality: N/A;  . POLYPECTOMY  08/08/2017   Procedure: POLYPECTOMY;  Surgeon: Danie Binder, MD;  Location: [facility] END[facility];  Service: Endoscopy;;  colon  . SUBLINGUAL CYST EXCISION     right shoulder     OB History    Gravida  1   Para      Term      Preterm      AB      Living  1     SAB      TAB      Ectopic      Multiple      Live Births               Home Medications    Prior to Admission medications   Medication Sig Start Date End Date Taking? Authorizing Provider  cariprazine (VRAYLAR) capsule Take 1 capsule (1.5 mg total) by mouth daily. 01/29/18   Norman Clay, MD  Cholecalciferol (D 2000) 2000 units TABS Take 2,000 Units by mouth  daily.    [provider]  cyclobenzaprine (FLEXERIL) 5 MG tablet Take 1 tablet (5 mg total) by mouth 3 (three) times daily as needed for muscle spasms. May take 1 or 2 Patient taking differently: Take 10 mg by mouth 3 (three) times daily as needed for muscle spasms.  06/29/17   Raylene Everts, MD  doxycycline (VIBRAMYCIN) 100 MG capsule Take 1 capsule (100 mg total) by mouth 2 (two) times daily. 04/17/18   Evalee Jefferson, PA-C  estradiol (ESTRACE) 1 MG tablet TAKE 1 TABLET BY MOUTH EVERY DAY 10/22/18   Derrek Monaco A, NP  IRON PO Take 2,000 mg by mouth daily.     [provider]  Iron-Vitamins (GERITOL COMPLETE PO) Take 1 tablet by mouth daily.     [provider]  linaclotide Rolan Lipa) 145 MCG CAPS capsule 1 PO 30 mins prior to OR WITH your first meal 08/08/17   Fields, Marga Melnick,  MD  naproxen (NAPROSYN) 500 MG tablet Take 1 tablet (500 mg total) by mouth 2 (two) times daily. 04/17/18   Evalee Jefferson, PA-C  polyethylene glycol powder (GLYCOLAX/MIRALAX) powder Take 17 g by mouth daily. 03/28/17   Raylene Everts, MD    Family History Family History  Problem Relation Age of Onset  . Hyperlipidemia Mother   . Hypertension Mother   . Liver cancer Father   . Alcohol abuse Father   . Mental illness Sister   . Mental illness Brother   . Colon cancer Neg Hx   . Colon polyps Neg Hx     Social History Social History   Tobacco Use  . Smoking status: Light Tobacco Smoker    Packs/day: 0.50    Years: 20.00    Pack years: 10.00    Types: Cigarettes  . Smokeless tobacco: Never Used  . Tobacco comment: one pack a week  Substance Use Topics  . Alcohol use: Yes    Comment: social , 01-12-2017 per pt yes on the Weekends  . Drug use: Yes    Frequency: 1.0 times per week    Types: Marijuana     Allergies   Patient has no known allergies.   Review of Systems Review of Systems  Constitutional: Negative for chills and fever.  HENT: Negative for sore throat.   Eyes: Negative for redness.  Respiratory: Negative for cough and shortness of breath.   Cardiovascular: Negative for chest pain.  Gastrointestinal: Negative for abdominal pain and vomiting.  Endocrine: Negative for polyuria.  Genitourinary: Positive for dysuria. Negative for flank pain.  Musculoskeletal: Negative for back pain.  Skin: Negative for rash.  Neurological: Negative for headaches.  Hematological: Does not bruise/bleed easily.  Psychiatric/Behavioral: Negative for confusion.     Physical Exam Updated Vital Signs BP 135/86 (BP Location: Left Arm)   Pulse 92   Temp 98.2 F (36.8 C) (Oral)   Resp (!) 22   Ht 1.575 m (5\' 2" )   Wt 86.6 kg   SpO2 96%   BMI 34.93 kg/m   Physical Exam Vitals signs and nursing note reviewed.  Constitutional:      Appearance: Normal appearance. She is  well-developed.  HENT:     Head: Atraumatic.     Nose: Nose normal.     Mouth/Throat:     Mouth: Mucous membranes are moist.  Eyes:     General: No scleral icterus.    Conjunctiva/sclera: Conjunctivae normal.  Neck:     Musculoskeletal: Neck supple.  Trachea: No tracheal deviation.  Cardiovascular:     Rate and Rhythm: Normal rate.     Pulses: Normal pulses.  Pulmonary:     Effort: Pulmonary effort is normal.  Abdominal:     General: Bowel sounds are normal. There is no distension.     Palpations: Abdomen is soft. There is no mass.     Tenderness: There is no abdominal tenderness. There is no guarding or rebound.     Hernia: No hernia is present.  Genitourinary:    Comments: No cva tenderness.  Musculoskeletal:        General: No swelling.  Skin:    General: Skin is warm and dry.     Findings: No rash.  Neurological:     Mental Status: She is alert.     Comments: Alert, speech normal.   Psychiatric:        Mood and Affect: Mood normal.      ED Treatments / Results  Labs (all labs ordered are listed, but only abnormal results are displayed) Results for orders placed or performed during the hospital encounter of 04/14/19  Urinalysis, Routine w reflex microscopic  Result Value Ref Range   Color, Urine YELLOW YELLOW   APPearance CLEAR CLEAR   Specific Gravity, Urine 1.018 1.005 - 1.030   pH 5.0 5.0 - 8.0   Glucose, UA NEGATIVE NEGATIVE mg/dL   Hgb urine dipstick NEGATIVE NEGATIVE   Bilirubin Urine NEGATIVE NEGATIVE   Ketones, ur NEGATIVE NEGATIVE mg/dL   Protein, ur NEGATIVE NEGATIVE mg/dL   Nitrite NEGATIVE NEGATIVE   Leukocytes,Ua LARGE (A) NEGATIVE   RBC / HPF >50 (H) 0 - 5 RBC/hpf   WBC, UA >50 (H) 0 - 5 WBC/hpf   Bacteria, UA RARE (A) NONE SEEN   Squamous Epithelial / LPF 21-50 0 - 5   EKG None  Radiology No results found.  Procedures Procedures (including critical care time)  Medications Ordered in ED Medications - No data to display    Initial Impression / Assessment and Plan / ED Course  I have reviewed the triage vital signs and the nursing notes.  Pertinent labs & imaging results that were available during my care of the patient were reviewed by me and considered in my medical decision making (see chart for details).  Labs sent.   Reviewed nursing notes and prior charts for additional history.   Labs reviewed by me - ua with large LE, > 50 wbc, c/w uti.   Confirmed nkda.   Keflex po.  Patient appears stable for d/c.  rx for home.   Return precautions provided.     Final Clinical Impressions(s) / ED Diagnoses   Final diagnoses:  None    ED Discharge Orders    None       Lajean Saver, MD 04/14/19 2155

## 2019-04-16 DIAGNOSIS — N390 Urinary tract infection, site not specified: Secondary | ICD-10-CM | POA: Diagnosis not present

## 2019-04-16 DIAGNOSIS — G8929 Other chronic pain: Secondary | ICD-10-CM | POA: Diagnosis not present

## 2019-07-09 ENCOUNTER — Encounter: Payer: Self-pay | Admitting: Adult Health

## 2019-07-15 ENCOUNTER — Other Ambulatory Visit: Payer: Medicare Other | Admitting: Adult Health

## 2019-07-15 DIAGNOSIS — G8929 Other chronic pain: Secondary | ICD-10-CM | POA: Diagnosis not present

## 2019-07-15 DIAGNOSIS — Z79891 Long term (current) use of opiate analgesic: Secondary | ICD-10-CM | POA: Diagnosis not present

## 2019-07-15 DIAGNOSIS — M545 Low back pain: Secondary | ICD-10-CM | POA: Diagnosis not present

## 2019-07-17 ENCOUNTER — Other Ambulatory Visit (HOSPITAL_COMMUNITY): Payer: Self-pay | Admitting: Internal Medicine

## 2019-08-05 ENCOUNTER — Other Ambulatory Visit (HOSPITAL_COMMUNITY): Payer: Self-pay | Admitting: Adult Health Nurse Practitioner

## 2019-08-05 DIAGNOSIS — N644 Mastodynia: Secondary | ICD-10-CM

## 2019-08-28 ENCOUNTER — Ambulatory Visit (HOSPITAL_COMMUNITY): Admission: RE | Admit: 2019-08-28 | Payer: Medicare Other | Source: Ambulatory Visit

## 2019-08-28 ENCOUNTER — Other Ambulatory Visit: Payer: Self-pay

## 2019-08-28 ENCOUNTER — Ambulatory Visit (HOSPITAL_COMMUNITY): Payer: Medicare Other

## 2019-08-28 ENCOUNTER — Ambulatory Visit (HOSPITAL_COMMUNITY)
Admission: RE | Admit: 2019-08-28 | Discharge: 2019-08-28 | Disposition: A | Discharge status: Home / Self Care | Payer: Medicare Other | Source: Ambulatory Visit | Attending: Adult Health Nurse Practitioner | Admitting: Adult Health Nurse Practitioner

## 2019-08-28 DIAGNOSIS — R922 Inconclusive mammogram: Secondary | ICD-10-CM | POA: Diagnosis not present

## 2019-08-28 DIAGNOSIS — N644 Mastodynia: Secondary | ICD-10-CM | POA: Diagnosis not present

## 2019-10-07 ENCOUNTER — Telehealth: Payer: Self-pay | Admitting: Adult Health

## 2019-10-07 NOTE — Telephone Encounter (Signed)

## 2019-10-08 ENCOUNTER — Other Ambulatory Visit: Payer: Medicare Other | Admitting: Adult Health

## 2019-10-08 DIAGNOSIS — N951 Menopausal and female climacteric states: Secondary | ICD-10-CM | POA: Diagnosis not present

## 2019-10-08 DIAGNOSIS — M545 Low back pain: Secondary | ICD-10-CM | POA: Diagnosis not present

## 2019-10-08 DIAGNOSIS — M62830 Muscle spasm of back: Secondary | ICD-10-CM | POA: Diagnosis not present

## 2019-10-08 DIAGNOSIS — K581 Irritable bowel syndrome with constipation: Secondary | ICD-10-CM | POA: Diagnosis not present

## 2019-10-16 DIAGNOSIS — G8929 Other chronic pain: Secondary | ICD-10-CM | POA: Diagnosis not present

## 2019-10-16 DIAGNOSIS — Z79891 Long term (current) use of opiate analgesic: Secondary | ICD-10-CM | POA: Diagnosis not present

## 2019-10-16 DIAGNOSIS — R945 Abnormal results of liver function studies: Secondary | ICD-10-CM | POA: Diagnosis not present

## 2019-10-16 DIAGNOSIS — M545 Low back pain: Secondary | ICD-10-CM | POA: Diagnosis not present

## 2019-10-21 ENCOUNTER — Telehealth: Payer: Self-pay | Admitting: Obstetrics & Gynecology

## 2019-10-21 NOTE — Telephone Encounter (Signed)
Tried to reach the patient to remind her of her appointment/restrictions, mailbox not setup.

## 2019-10-22 ENCOUNTER — Other Ambulatory Visit: Payer: Medicare Other | Admitting: Obstetrics & Gynecology

## 2019-12-12 DIAGNOSIS — M62830 Muscle spasm of back: Secondary | ICD-10-CM | POA: Diagnosis not present

## 2019-12-12 DIAGNOSIS — M545 Low back pain: Secondary | ICD-10-CM | POA: Diagnosis not present

## 2019-12-12 DIAGNOSIS — K581 Irritable bowel syndrome with constipation: Secondary | ICD-10-CM | POA: Diagnosis not present

## 2019-12-20 DIAGNOSIS — G8929 Other chronic pain: Secondary | ICD-10-CM | POA: Diagnosis not present

## 2019-12-20 DIAGNOSIS — M545 Low back pain: Secondary | ICD-10-CM | POA: Diagnosis not present

## 2019-12-20 DIAGNOSIS — Z0000 Encounter for general adult medical examination without abnormal findings: Secondary | ICD-10-CM | POA: Diagnosis not present

## 2019-12-20 DIAGNOSIS — Z79891 Long term (current) use of opiate analgesic: Secondary | ICD-10-CM | POA: Diagnosis not present

## 2019-12-23 ENCOUNTER — Telehealth: Payer: Self-pay | Admitting: Adult Health

## 2019-12-23 NOTE — Telephone Encounter (Signed)
Tried to reach the patient by phone to remind her of her appointment/restrictions, no answer or machine, phone just kept ringing.

## 2019-12-24 ENCOUNTER — Other Ambulatory Visit: Payer: Medicare Other | Admitting: Adult Health

## 2020-02-10 DIAGNOSIS — S0990XA Unspecified injury of head, initial encounter: Secondary | ICD-10-CM | POA: Diagnosis not present

## 2020-02-10 DIAGNOSIS — S0993XA Unspecified injury of face, initial encounter: Secondary | ICD-10-CM | POA: Diagnosis not present

## 2020-02-10 DIAGNOSIS — S199XXA Unspecified injury of neck, initial encounter: Secondary | ICD-10-CM | POA: Diagnosis not present

## 2020-02-10 DIAGNOSIS — S0083XA Contusion of other part of head, initial encounter: Secondary | ICD-10-CM | POA: Diagnosis not present

## 2020-02-10 DIAGNOSIS — S0081XA Abrasion of other part of head, initial encounter: Secondary | ICD-10-CM | POA: Diagnosis not present

## 2020-02-10 DIAGNOSIS — Z9089 Acquired absence of other organs: Secondary | ICD-10-CM | POA: Diagnosis not present

## 2020-02-11 ENCOUNTER — Telehealth: Payer: Self-pay | Admitting: Adult Health

## 2020-02-11 NOTE — Telephone Encounter (Signed)
Tried to reach the patient to remind her of her appointment/restrictions, mailbox not setup.

## 2020-02-12 ENCOUNTER — Other Ambulatory Visit: Payer: Self-pay

## 2020-02-12 ENCOUNTER — Ambulatory Visit (INDEPENDENT_AMBULATORY_CARE_PROVIDER_SITE_OTHER): Payer: Medicare Other | Admitting: Adult Health

## 2020-02-12 ENCOUNTER — Other Ambulatory Visit (HOSPITAL_COMMUNITY)
Admission: RE | Admit: 2020-02-12 | Discharge: 2020-02-12 | Disposition: A | Discharge status: Home / Self Care | Payer: Medicare Other | Source: Ambulatory Visit | Attending: Adult Health | Admitting: Adult Health

## 2020-02-12 ENCOUNTER — Encounter: Payer: Self-pay | Admitting: Adult Health

## 2020-02-12 VITALS — BP 101/74 | HR 108 | Ht 63.0 in | Wt 186.0 lb

## 2020-02-12 DIAGNOSIS — Z113 Encounter for screening for infections with a predominantly sexual mode of transmission: Secondary | ICD-10-CM

## 2020-02-12 DIAGNOSIS — K5900 Constipation, unspecified: Secondary | ICD-10-CM

## 2020-02-12 DIAGNOSIS — Z1211 Encounter for screening for malignant neoplasm of colon: Secondary | ICD-10-CM | POA: Diagnosis not present

## 2020-02-12 DIAGNOSIS — Z01419 Encounter for gynecological examination (general) (routine) without abnormal findings: Secondary | ICD-10-CM | POA: Insufficient documentation

## 2020-02-12 DIAGNOSIS — Z1212 Encounter for screening for malignant neoplasm of rectum: Secondary | ICD-10-CM | POA: Diagnosis not present

## 2020-02-12 DIAGNOSIS — K649 Unspecified hemorrhoids: Secondary | ICD-10-CM

## 2020-02-12 DIAGNOSIS — H9313 Tinnitus, bilateral: Secondary | ICD-10-CM

## 2020-02-12 DIAGNOSIS — R232 Flushing: Secondary | ICD-10-CM

## 2020-02-12 DIAGNOSIS — F32A Depression, unspecified: Secondary | ICD-10-CM

## 2020-02-12 DIAGNOSIS — F329 Major depressive disorder, single episode, unspecified: Secondary | ICD-10-CM

## 2020-02-12 LAB — HEMOCCULT GUIAC POC 1CARD (OFFICE): Fecal Occult Blood, POC: NEGATIVE

## 2020-02-12 MED ORDER — ESTRADIOL 1 MG PO TABS: 1.0000 mg | ORAL_TABLET | Freq: Every day | ORAL | 4 refills | Status: DC
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
## 2020-02-14 LAB — CERVICOVAGINAL ANCILLARY ONLY
Chlamydia: NEGATIVE
Comment: NEGATIVE
Comment: NEGATIVE
Comment: NORMAL
Neisseria Gonorrhea: NEGATIVE
Trichomonas: NEGATIVE

## 2020-02-17 ENCOUNTER — Telehealth: Payer: Self-pay | Admitting: *Deleted

## 2020-02-17 NOTE — Telephone Encounter (Signed)
Voice mail not set up @ 10:55 am. No other # listed. Results are negative. Encounter closed. [place]

## 2020-02-17 NOTE — Telephone Encounter (Signed)
-----   Message from Estill Dooms, NP sent at 02/17/2020  8:55 AM EDT ----- Let pt know CV negative

## 2020-03-30 DIAGNOSIS — D241 Benign neoplasm of right breast: Secondary | ICD-10-CM | POA: Diagnosis not present

## 2020-03-30 DIAGNOSIS — E875 Hyperkalemia: Secondary | ICD-10-CM | POA: Diagnosis not present

## 2020-03-30 DIAGNOSIS — R7301 Impaired fasting glucose: Secondary | ICD-10-CM | POA: Diagnosis not present

## 2020-04-14 DIAGNOSIS — Z79891 Long term (current) use of opiate analgesic: Secondary | ICD-10-CM | POA: Diagnosis not present

## 2020-04-14 DIAGNOSIS — G8929 Other chronic pain: Secondary | ICD-10-CM | POA: Diagnosis not present

## 2020-04-14 DIAGNOSIS — M545 Low back pain: Secondary | ICD-10-CM | POA: Diagnosis not present

## 2020-04-14 DIAGNOSIS — Z0189 Encounter for other specified special examinations: Secondary | ICD-10-CM | POA: Diagnosis not present

## 2020-06-25 DIAGNOSIS — R5381 Other malaise: Secondary | ICD-10-CM | POA: Diagnosis not present

## 2020-06-25 DIAGNOSIS — R69 Illness, unspecified: Secondary | ICD-10-CM | POA: Diagnosis not present

## 2020-10-21 DIAGNOSIS — R825 Elevated urine levels of drugs, medicaments and biological substances: Secondary | ICD-10-CM | POA: Diagnosis not present

## 2020-10-21 DIAGNOSIS — M545 Low back pain, unspecified: Secondary | ICD-10-CM | POA: Diagnosis not present

## 2021-03-14 ENCOUNTER — Other Ambulatory Visit: Payer: Self-pay | Admitting: Adult Health

## 2021-05-11 DIAGNOSIS — R5381 Other malaise: Secondary | ICD-10-CM | POA: Diagnosis not present

## 2022-08-03 ENCOUNTER — Encounter: Payer: Self-pay | Admitting: *Deleted

## 2022-09-07 DIAGNOSIS — S39012A Strain of muscle, fascia and tendon of lower back, initial encounter: Secondary | ICD-10-CM | POA: Diagnosis not present

## 2023-09-21 DIAGNOSIS — H65194 Other acute nonsuppurative otitis media, recurrent, right ear: Secondary | ICD-10-CM | POA: Diagnosis not present

## 2023-09-21 DIAGNOSIS — W01198A Fall on same level from slipping, tripping and stumbling with subsequent striking against other object, initial encounter: Secondary | ICD-10-CM | POA: Diagnosis not present

## 2023-09-21 DIAGNOSIS — F17200 Nicotine dependence, unspecified, uncomplicated: Secondary | ICD-10-CM | POA: Diagnosis not present

## 2023-09-21 DIAGNOSIS — S0083XA Contusion of other part of head, initial encounter: Secondary | ICD-10-CM | POA: Diagnosis not present

## 2023-09-21 DIAGNOSIS — H65191 Other acute nonsuppurative otitis media, right ear: Secondary | ICD-10-CM | POA: Diagnosis not present

## 2023-09-21 DIAGNOSIS — Z5941 Food insecurity: Secondary | ICD-10-CM | POA: Diagnosis not present

## 2024-03-28 DIAGNOSIS — Z720 Tobacco use: Secondary | ICD-10-CM | POA: Diagnosis not present

## 2024-03-28 DIAGNOSIS — E876 Hypokalemia: Secondary | ICD-10-CM | POA: Diagnosis not present

## 2024-03-28 DIAGNOSIS — W57XXXA Bitten or stung by nonvenomous insect and other nonvenomous arthropods, initial encounter: Secondary | ICD-10-CM | POA: Diagnosis not present

## 2024-03-28 DIAGNOSIS — K047 Periapical abscess without sinus: Secondary | ICD-10-CM | POA: Diagnosis not present

## 2024-03-28 DIAGNOSIS — K0889 Other specified disorders of teeth and supporting structures: Secondary | ICD-10-CM | POA: Diagnosis not present

## 2024-03-28 DIAGNOSIS — S025XXA Fracture of tooth (traumatic), initial encounter for closed fracture: Secondary | ICD-10-CM | POA: Diagnosis not present

## 2024-03-28 DIAGNOSIS — S1096XA Insect bite of unspecified part of neck, initial encounter: Secondary | ICD-10-CM | POA: Diagnosis not present

## 2024-03-28 DIAGNOSIS — J3489 Other specified disorders of nose and nasal sinuses: Secondary | ICD-10-CM | POA: Diagnosis not present

## 2024-03-28 DIAGNOSIS — R4781 Slurred speech: Secondary | ICD-10-CM | POA: Diagnosis not present

## 2024-04-19 DIAGNOSIS — F17210 Nicotine dependence, cigarettes, uncomplicated: Secondary | ICD-10-CM | POA: Diagnosis not present

## 2024-04-19 DIAGNOSIS — R1031 Right lower quadrant pain: Secondary | ICD-10-CM | POA: Diagnosis not present

## 2024-04-19 DIAGNOSIS — R109 Unspecified abdominal pain: Secondary | ICD-10-CM | POA: Diagnosis not present

## 2024-04-19 DIAGNOSIS — R300 Dysuria: Secondary | ICD-10-CM | POA: Diagnosis not present

## 2024-07-31 DIAGNOSIS — R079 Chest pain, unspecified: Secondary | ICD-10-CM | POA: Diagnosis not present

## 2024-07-31 DIAGNOSIS — F17210 Nicotine dependence, cigarettes, uncomplicated: Secondary | ICD-10-CM | POA: Diagnosis not present

## 2024-07-31 DIAGNOSIS — R0789 Other chest pain: Secondary | ICD-10-CM | POA: Diagnosis not present
